# Patient Record
Sex: Male | Born: 1985
Health system: Southern US, Community
[De-identification: ages and names within clinical notes are randomized; demographics above are authoritative.]

## PROBLEM LIST (undated history)

## (undated) DIAGNOSIS — N289 Disorder of kidney and ureter, unspecified: Secondary | ICD-10-CM

## (undated) DIAGNOSIS — K219 Gastro-esophageal reflux disease without esophagitis: Secondary | ICD-10-CM

## (undated) DIAGNOSIS — T7840XA Allergy, unspecified, initial encounter: Secondary | ICD-10-CM

## (undated) DIAGNOSIS — N12 Tubulo-interstitial nephritis, not specified as acute or chronic: Secondary | ICD-10-CM

## (undated) DIAGNOSIS — N135 Crossing vessel and stricture of ureter without hydronephrosis: Secondary | ICD-10-CM

## (undated) HISTORY — DX: Gastro-esophageal reflux disease without esophagitis: K21.9

## (undated) HISTORY — PX: NEPHRECTOMY: SHX65

## (undated) HISTORY — PX: TONSILLECTOMY: SUR1361

## (undated) HISTORY — DX: Allergy, unspecified, initial encounter: T78.40XA

---

## 2012-08-25 DIAGNOSIS — N289 Disorder of kidney and ureter, unspecified: Secondary | ICD-10-CM

## 2012-08-25 DIAGNOSIS — N12 Tubulo-interstitial nephritis, not specified as acute or chronic: Secondary | ICD-10-CM

## 2012-08-25 DIAGNOSIS — Q6211 Congenital occlusion of ureteropelvic junction: Secondary | ICD-10-CM

## 2012-08-25 HISTORY — DX: Disorder of kidney and ureter, unspecified: N28.9

## 2012-08-25 HISTORY — DX: Congenital occlusion of ureteropelvic junction: Q62.11

## 2012-08-25 HISTORY — PX: KIDNEY CYST REMOVAL: SHX684

## 2012-08-25 HISTORY — DX: Tubulo-interstitial nephritis, not specified as acute or chronic: N12

## 2012-10-14 ENCOUNTER — Emergency Department (HOSPITAL_COMMUNITY)
Admission: EM | Admit: 2012-10-14 | Discharge: 2012-10-14 | Disposition: A | Discharge status: Home / Self Care | Payer: Medicaid - Out of State | Attending: Emergency Medicine | Admitting: Emergency Medicine

## 2012-10-14 ENCOUNTER — Emergency Department (HOSPITAL_COMMUNITY): Payer: Medicaid - Out of State

## 2012-10-14 ENCOUNTER — Encounter (HOSPITAL_COMMUNITY): Payer: Self-pay | Admitting: *Deleted

## 2012-10-14 DIAGNOSIS — G8918 Other acute postprocedural pain: Secondary | ICD-10-CM | POA: Insufficient documentation

## 2012-10-14 DIAGNOSIS — R109 Unspecified abdominal pain: Secondary | ICD-10-CM | POA: Insufficient documentation

## 2012-10-14 DIAGNOSIS — Z905 Acquired absence of kidney: Secondary | ICD-10-CM | POA: Insufficient documentation

## 2012-10-14 DIAGNOSIS — F17200 Nicotine dependence, unspecified, uncomplicated: Secondary | ICD-10-CM | POA: Insufficient documentation

## 2012-10-14 DIAGNOSIS — J45909 Unspecified asthma, uncomplicated: Secondary | ICD-10-CM | POA: Insufficient documentation

## 2012-10-14 DIAGNOSIS — Z87448 Personal history of other diseases of urinary system: Secondary | ICD-10-CM | POA: Insufficient documentation

## 2012-10-14 HISTORY — DX: Disorder of kidney and ureter, unspecified: N28.9

## 2012-10-14 HISTORY — DX: Crossing vessel and stricture of ureter without hydronephrosis: N13.5

## 2012-10-14 HISTORY — DX: Tubulo-interstitial nephritis, not specified as acute or chronic: N12

## 2012-10-14 LAB — CBC WITH DIFFERENTIAL/PLATELET
Basophils Absolute: 0 10*3/uL (ref 0.0–0.1)
Basophils Relative: 0 % (ref 0–1)
Eosinophils Absolute: 0.1 10*3/uL (ref 0.0–0.7)
Hemoglobin: 12.7 g/dL — ABNORMAL LOW (ref 13.0–17.0)
MCH: 25.3 pg — ABNORMAL LOW (ref 26.0–34.0)
MCHC: 33.4 g/dL (ref 30.0–36.0)
Monocytes Relative: 8 % (ref 3–12)
Neutro Abs: 2.6 10*3/uL (ref 1.7–7.7)
Neutrophils Relative %: 46 % (ref 43–77)
Platelets: 179 10*3/uL (ref 150–400)
RDW: 14.4 % (ref 11.5–15.5)

## 2012-10-14 LAB — URINALYSIS, ROUTINE W REFLEX MICROSCOPIC
Bilirubin Urine: NEGATIVE
Ketones, ur: NEGATIVE mg/dL
Leukocytes, UA: NEGATIVE
Nitrite: NEGATIVE
Protein, ur: NEGATIVE mg/dL
Urobilinogen, UA: 1 mg/dL (ref 0.0–1.0)
pH: 6 (ref 5.0–8.0)

## 2012-10-14 LAB — COMPREHENSIVE METABOLIC PANEL
AST: 10 U/L (ref 0–37)
Albumin: 3.8 g/dL (ref 3.5–5.2)
Alkaline Phosphatase: 63 U/L (ref 39–117)
BUN: 20 mg/dL (ref 6–23)
Chloride: 105 mEq/L (ref 96–112)
Creatinine, Ser: 1.24 mg/dL (ref 0.50–1.35)
Potassium: 4 mEq/L (ref 3.5–5.1)
Total Protein: 7.2 g/dL (ref 6.0–8.3)

## 2012-10-14 MED ORDER — ONDANSETRON HCL 4 MG/2ML IJ SOLN
4.0000 mg | Freq: Once | INTRAMUSCULAR | Status: AC
  Administered 2012-10-14: 4 mg via INTRAVENOUS
  Filled 2012-10-14: qty 2

## 2012-10-14 MED ORDER — OXYCODONE HCL 5 MG PO TABS: 5.0000 mg | ORAL_TABLET | ORAL | Status: DC | PRN

## 2012-10-14 MED ORDER — MORPHINE SULFATE 4 MG/ML IJ SOLN
4.0000 mg | Freq: Once | INTRAMUSCULAR | Status: AC
  Administered 2012-10-14: 4 mg via INTRAVENOUS
  Filled 2012-10-14: qty 1

## 2012-10-14 MED ORDER — HYDROCODONE-ACETAMINOPHEN 5-325 MG PO TABS: 1.0000 | ORAL_TABLET | Freq: Four times a day (QID) | ORAL | Status: DC | PRN

## 2012-10-14 NOTE — ED Notes (Signed)
Pt in s/p right nephrectomy on 5/6, pt was seen for this in [place] state, but has been in this area for the last two weeks due to him mother being sick, states he had a follow up visit prior to leaving the area and was told everything looked ok, but is in today due to continued pain, states it was going away but now it is coming back again.

## 2012-10-14 NOTE — ED Notes (Addendum)
Pt had sx on 6th of last month, removal of right kidney. 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 sts he is here today because he hasn't been able to sleep with the pain getting so bad. Pt in nad, skin warm and dry, resp e/u.

## 2012-10-14 NOTE — ED Provider Notes (Signed)
History    CSN: [id number] Arrival date & time 10/14/12  1616  First MD Initiated Contact with Patient 10/14/12 1917     Chief Complaint  Patient presents with  . Post-op Problem   (Consider location/radiation/quality/duration/timing/severity/associated sxs/prior Treatment) HPI patient presents emergency department with left flank and right-sided abdominal pain.  Patient, states he had nephrectomy with right kidney.  3 months ago.  Patient, states, that he's not had any nausea, vomiting, diarrhea, chest pain, shortness of breath, fever, cough, weakness, numbness, dizziness, headache, blurred vision, or syncope.  Patient, states, that nothing seems to make his condition, better and palpation makes his pain, worse.  Patient, states, that he needs pain medication as this seemed to help with his symptoms.  Patient, states, that he does not have a doctor here in [place] that follows him. Past Medical History  Diagnosis Date  . Renal disorder 08/25/12    hydronephrosis  . Pyelonephritis 08/25/12  . 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 Asthma    Past Surgical History  Procedure Laterality Date  . Nephrectomy    . Tonsillectomy     Family History  Problem Relation Age of Onset  . Cancer Mother     lung, stage IV   History  Substance Use Topics  . Smoking status: Current Every Day Smoker -- 0.25 packs/day    Types: Cigarettes  . Smokeless tobacco: Not on file  . Alcohol Use: Yes     Comment: occasionally    Review of Systems All other systems negative except as documented in the HPI. All pertinent positives and negatives as reviewed in the HPI. Allergies  Review of patient's allergies indicates no known allergies.  Home Medications   Current Outpatient Rx  Name  Route  Sig  Dispense  Refill  . acetaminophen (TYLENOL) 500 MG tablet   Oral   Take 500 mg by mouth daily as needed for pain.         Marland Kitchen ibuprofen (ADVIL,MOTRIN) 200 MG tablet   Oral   Take 200 mg  by mouth daily as needed for pain.          BP 121/67  Pulse 60  Temp(Src) 98.2 F (36.8 C) (Oral)  Resp 18  Wt 190 lb (86.183 kg)  SpO2 99% Physical Exam  Nursing note and vitals reviewed. Constitutional: He is oriented to person, place, and time. He appears well-developed and well-nourished. No distress.  HENT:  Head: Normocephalic and atraumatic.  Mouth/Throat: Oropharynx is clear and moist.  Eyes: Pupils are equal, round, and reactive to light.  Neck: Normal range of motion. Neck supple.  Cardiovascular: Normal rate, regular rhythm and normal heart sounds.  Exam reveals no gallop and no friction rub.   No murmur heard. Pulmonary/Chest: Effort normal and breath sounds normal.  Abdominal: Soft. Bowel sounds are normal. He exhibits no distension. There is tenderness. There is no rebound and no guarding.  Neurological: He is alert and oriented to person, place, and time.  Skin: Skin is warm and dry. No rash noted. No erythema.    ED Course  Procedures (including critical care time) Labs Reviewed  CBC WITH DIFFERENTIAL - Abnormal; Notable for the following:    Hemoglobin 12.7 (*)    HCT 38.0 (*)    MCV 75.8 (*)    MCH 25.3 (*)    All other components within normal limits  COMPREHENSIVE METABOLIC PANEL - Abnormal; Notable for the following:    Glucose, Bld 100 (*)  GFR calc non Af Amer 79 (*)    All other components within normal limits  URINALYSIS, ROUTINE W REFLEX MICROSCOPIC   Ct Abdomen Pelvis Wo Contrast  10/14/2012   *RADIOLOGY REPORT*  Clinical Data: Right kidney removal 08/25/2012.  Bilateral flank pain.  CT ABDOMEN AND PELVIS WITHOUT CONTRAST  Technique:  Multidetector CT imaging of the abdomen and pelvis was performed following the standard protocol without intravenous contrast.  Comparison: None  Findings: Right kidney is surgically absent.  No fluid collection or mass lesion is seen in the renal bed on the right.  The left kidney is normal.  No left renal  calculus or obstruction.  Urinary bladder appears normal.  Lung bases are clear.  The liver gallbladder and bile ducts are normal.  Pancreas and spleen are normal.  Negative for bowel obstruction or bowel thickening.  Appendix is normal.  No free fluid.  IMPRESSION: Postop right nephrectomy without mass or fluid collection.  No acute abnormality.  Normal appendix.   Original Report Authenticated By: Janeece Riggers, M.D.   patient is, negative, CT scan and lab testing was mostly unremarkable.  Patient is advised to followup with her primary care Dr. patient feels better after pain medication and requests pain medication for home.  The patient would like to go home.  Patient is advised to return here for any worsening in his condition. MDM  MDM Reviewed: nursing note and vitals Interpretation: labs and CT scan      Carlyle Dolly, PA-C 10/16/12 0128

## 2012-10-17 NOTE — ED Provider Notes (Signed)
Medical screening examination/treatment/procedure(s) were performed by non-physician practitioner and as supervising physician I was immediately available for consultation/collaboration.  Doug Sou, MD 10/17/12 [phone]

## 2013-08-27 ENCOUNTER — Ambulatory Visit (INDEPENDENT_AMBULATORY_CARE_PROVIDER_SITE_OTHER): Payer: BC Managed Care – PPO | Admitting: Emergency Medicine

## 2013-08-27 VITALS — BP 128/86 | HR 81 | Temp 98.4°F | Resp 16 | Ht 67.5 in | Wt 224.6 lb

## 2013-08-27 DIAGNOSIS — Z765 Malingerer [conscious simulation]: Secondary | ICD-10-CM

## 2013-08-27 DIAGNOSIS — F1910 Other psychoactive substance abuse, uncomplicated: Secondary | ICD-10-CM

## 2013-08-27 NOTE — Progress Notes (Signed)
[facility] [address], [place] [address] [phone]  Date:  08/27/2013   Name:  Kyle Crane   DOB:  June 22, 1985   MRN:  [id number]  PCP:  No PCP Per Patient    Chief Complaint: Back Pain and Medication Refill   History of Present Illness:  Kyle Crane is a 28 y.o. very pleasant male patient who presents with the following:  Patient gives a history of pain in back related to chronic kidney condition.  Moved here from [place] 3 months ago to care for his mother whose health is failing.  He has been to a number of urgent cares and presents here for a refill on is meds as the prescription he received YESTERDAY was taken by a pharmacy due to a problem.  Denies other complaint or health concern today.   There are no active problems to display for this patient.   Past Medical History  Diagnosis Date  . Renal disorder 08/25/12    hydronephrosis  . Pyelonephritis 08/25/12  . 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 Allergy     Past Surgical History  Procedure Laterality Date  . Nephrectomy    . Tonsillectomy    . Kidney cyst removal  Aug 25 2012    History  Substance Use Topics  . Smoking status: Current Every Day Smoker -- 0.25 packs/day    Types: Cigarettes  . Smokeless tobacco: Not on file  . Alcohol Use: Yes     Comment: occasionally    Family History  Problem Relation Age of Onset  . Cancer Mother     lung, stage IV    Allergies  Allergen Reactions  . Tylenol [Acetaminophen]     Medication list has been reviewed and updated.  Current Outpatient Prescriptions on File Prior to Visit  Medication Sig Dispense Refill  . ibuprofen (ADVIL,MOTRIN) 200 MG tablet Take 200 mg by mouth daily as needed for pain.      Marland Kitchen. oxyCODONE (ROXICODONE) 5 MG immediate release tablet Take 1 tablet (5 mg total) by mouth every 4 (four) hours as needed for pain.  15 tablet  0  . acetaminophen (TYLENOL) 500 MG tablet Take 500 mg by mouth daily as  needed for pain.       No current facility-administered medications on file prior to visit.    Review of Systems:  As per HPI, otherwise negative.    Physical Examination: Filed Vitals:   08/27/13 1352  BP: 128/86  Pulse: 81  Temp: 98.4 F (36.9 C)  Resp: 16   Filed Vitals:   08/27/13 1352  Height: 5' 7.5" (1.715 m)  Weight: 224 lb 9.6 oz (101.878 kg)   Body mass index is 34.64 kg/(m^2). Ideal Body Weight: Weight in (lb) to have BMI = 25: 161.7   GEN: WDWN, NAD, Non-toxic, Alert & Oriented x 3 HEENT: Atraumatic, Normocephalic.  Ears and Nose: No external deformity. EXTR: No clubbing/cyanosis/edema NEURO: Normal gait.  PSYCH: Normally interactive. Conversant. Not depressed or anxious appearing.  Calm demeanor.    Assessment and Plan: Patient appears to have been urgency care shopping.  No medications were prescribed.  He was invited to bring or obtain medical records substantiating his narcotic need.  Signed,  Phillips OdorJeffery Solveig Fangman, MD

## 2013-10-30 ENCOUNTER — Emergency Department (HOSPITAL_COMMUNITY): Payer: BLUE CROSS/BLUE SHIELD

## 2013-10-30 ENCOUNTER — Encounter (HOSPITAL_COMMUNITY): Payer: Self-pay | Admitting: Emergency Medicine

## 2013-10-30 ENCOUNTER — Other Ambulatory Visit (HOSPITAL_COMMUNITY): Payer: Medicaid - Out of State

## 2013-10-30 ENCOUNTER — Emergency Department (HOSPITAL_COMMUNITY)
Admission: EM | Admit: 2013-10-30 | Discharge: 2013-10-30 | Disposition: A | Discharge status: Home / Self Care | Payer: BLUE CROSS/BLUE SHIELD | Attending: Emergency Medicine | Admitting: Emergency Medicine

## 2013-10-30 DIAGNOSIS — Z905 Acquired absence of kidney: Secondary | ICD-10-CM | POA: Diagnosis not present

## 2013-10-30 DIAGNOSIS — J029 Acute pharyngitis, unspecified: Secondary | ICD-10-CM | POA: Insufficient documentation

## 2013-10-30 DIAGNOSIS — M545 Low back pain, unspecified: Secondary | ICD-10-CM | POA: Diagnosis not present

## 2013-10-30 DIAGNOSIS — Z87448 Personal history of other diseases of urinary system: Secondary | ICD-10-CM | POA: Insufficient documentation

## 2013-10-30 DIAGNOSIS — F17200 Nicotine dependence, unspecified, uncomplicated: Secondary | ICD-10-CM | POA: Insufficient documentation

## 2013-10-30 DIAGNOSIS — R5381 Other malaise: Secondary | ICD-10-CM | POA: Insufficient documentation

## 2013-10-30 DIAGNOSIS — M549 Dorsalgia, unspecified: Secondary | ICD-10-CM | POA: Diagnosis present

## 2013-10-30 DIAGNOSIS — R5383 Other fatigue: Secondary | ICD-10-CM

## 2013-10-30 LAB — CBC WITH DIFFERENTIAL/PLATELET
BASOS ABS: 0 10*3/uL (ref 0.0–0.1)
BASOS PCT: 0 % (ref 0–1)
Eosinophils Absolute: 0.1 10*3/uL (ref 0.0–0.7)
Eosinophils Relative: 2 % (ref 0–5)
HCT: 39.1 % (ref 39.0–52.0)
HEMOGLOBIN: 12.6 g/dL — AB (ref 13.0–17.0)
Lymphocytes Relative: 38 % (ref 12–46)
Lymphs Abs: 2.1 10*3/uL (ref 0.7–4.0)
MCH: 24.9 pg — ABNORMAL LOW (ref 26.0–34.0)
MCHC: 32.2 g/dL (ref 30.0–36.0)
MCV: 77.3 fL — ABNORMAL LOW (ref 78.0–100.0)
MONOS PCT: 8 % (ref 3–12)
Monocytes Absolute: 0.4 10*3/uL (ref 0.1–1.0)
NEUTROS ABS: 3 10*3/uL (ref 1.7–7.7)
NEUTROS PCT: 52 % (ref 43–77)
PLATELETS: 206 10*3/uL (ref 150–400)
RBC: 5.06 MIL/uL (ref 4.22–5.81)
RDW: 15.3 % (ref 11.5–15.5)
WBC: 5.7 10*3/uL (ref 4.0–10.5)

## 2013-10-30 LAB — URINALYSIS, ROUTINE W REFLEX MICROSCOPIC
BILIRUBIN URINE: NEGATIVE
GLUCOSE, UA: NEGATIVE mg/dL
Hgb urine dipstick: NEGATIVE
KETONES UR: 15 mg/dL — AB
Nitrite: NEGATIVE
PH: 6 (ref 5.0–8.0)
Protein, ur: NEGATIVE mg/dL
SPECIFIC GRAVITY, URINE: 1.021 (ref 1.005–1.030)
Urobilinogen, UA: 1 mg/dL (ref 0.0–1.0)

## 2013-10-30 LAB — COMPREHENSIVE METABOLIC PANEL
ALBUMIN: 3.9 g/dL (ref 3.5–5.2)
ALK PHOS: 55 U/L (ref 39–117)
ALT: 14 U/L (ref 0–53)
AST: 12 U/L (ref 0–37)
Anion gap: 13 (ref 5–15)
BILIRUBIN TOTAL: 0.7 mg/dL (ref 0.3–1.2)
BUN: 9 mg/dL (ref 6–23)
CHLORIDE: 104 meq/L (ref 96–112)
CO2: 25 mEq/L (ref 19–32)
Calcium: 9.2 mg/dL (ref 8.4–10.5)
Creatinine, Ser: 1.23 mg/dL (ref 0.50–1.35)
GFR calc Af Amer: >90 mL/min (ref >90)
GFR calc non Af Amer: 79 mL/min — ABNORMAL LOW (ref >90)
Glucose, Bld: 118 mg/dL — ABNORMAL HIGH (ref 70–99)
POTASSIUM: 4.2 meq/L (ref 3.7–5.3)
SODIUM: 142 meq/L (ref 137–147)
TOTAL PROTEIN: 7.3 g/dL (ref 6.0–8.3)

## 2013-10-30 LAB — URINE MICROSCOPIC-ADD ON

## 2013-10-30 LAB — RAPID STREP SCREEN (MED CTR MEBANE ONLY): Streptococcus, Group A Screen (Direct): NEGATIVE

## 2013-10-30 MED ORDER — TRAMADOL HCL 50 MG PO TABS: 50.0000 mg | ORAL_TABLET | Freq: Four times a day (QID) | ORAL | Status: DC | PRN

## 2013-10-30 MED ORDER — OXYCODONE HCL 5 MG PO TABS: 5.0000 mg | ORAL_TABLET | ORAL | Status: DC | PRN

## 2013-10-30 NOTE — ED Notes (Addendum)
Onset 1 week dysuria, frequency. No blood noted in urine.  Pt had right kidney removed last year.  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  Pain is increasing.  No numbness/tingling in LE. Onset this morning nausea.  Onset 3 days sore throat, painful to swallow.   No other s/s noted.

## 2013-10-30 NOTE — ED Notes (Signed)
Pt continues to be monitored by 5 lead, blood pressure, and pulse ox. Pt provided with urinal to provide urine specimen.

## 2013-10-30 NOTE — Discharge Instructions (Signed)
Drink plenty of fluids and get plenty of rest.  Return to emergency department if your symptoms substantially worsen or change, and follow up with your primary doctor if not improving in the next week.   Pharyngitis Pharyngitis is redness, pain, and swelling (inflammation) of your pharynx.  CAUSES  Pharyngitis is usually caused by infection. Most of the time, these infections are from viruses (viral) and are part of a cold. However, sometimes pharyngitis is caused by bacteria (bacterial). Pharyngitis can also be caused by allergies. Viral pharyngitis may be spread from person to person by coughing, sneezing, and personal items or utensils (cups, forks, spoons, toothbrushes). Bacterial pharyngitis may be spread from person to person by more intimate contact, such as kissing.  SIGNS AND SYMPTOMS  Symptoms of pharyngitis include:   Sore throat.   Tiredness (fatigue).   Low-grade fever.   Headache.  Joint pain and muscle aches.  Skin rashes.  Swollen lymph nodes.  Plaque-like film on throat or tonsils (often seen with bacterial pharyngitis). DIAGNOSIS  Your health care provider will ask you questions about your illness and your symptoms. Your medical history, along with a physical exam, is often all that is needed to diagnose pharyngitis. Sometimes, a rapid strep test is done. Other lab tests may also be done, depending on the suspected cause.  TREATMENT  Viral pharyngitis will usually get better in 3-4 days without the use of medicine. Bacterial pharyngitis is treated with medicines that kill germs (antibiotics).  HOME CARE INSTRUCTIONS   Drink enough water and fluids to keep your urine clear or pale yellow.   Only take over-the-counter or prescription medicines as directed by your health care provider:   If you are prescribed antibiotics, make sure you finish them even if you start to feel better.   Do not take aspirin.   Get lots of rest.   Gargle with 8 oz of salt  water ( tsp of salt per 1 qt of water) as often as every 1-2 hours to soothe your throat.   Throat lozenges (if you are not at risk for choking) or sprays may be used to soothe your throat. SEEK MEDICAL CARE IF:   You have large, tender lumps in your neck.  You have a rash.  You cough up green, yellow-brown, or bloody spit. SEEK IMMEDIATE MEDICAL CARE IF:   Your neck becomes stiff.  You drool or are unable to swallow liquids.  You vomit or are unable to keep medicines or liquids down.  You have severe pain that does not go away with the use of recommended medicines.  You have trouble breathing (not caused by a stuffy nose). MAKE SURE YOU:   Understand these instructions.  Will watch your condition.  Will get help right away if you are not doing well or get worse. Document Released: 04/08/2005 Document Revised: 01/27/2013 Document Reviewed: 12/14/2012 [facility] Patient Information 2015 [employer] [place]. This information is not intended to replace advice given to you by your health care provider. Make sure you discuss any questions you have with your health care provider.

## 2013-10-30 NOTE — ED Notes (Signed)
Pt placed back on monitor upon return to room from radiology. Pt remains monitored by 5 lead, blood pressure, and pulse ox.

## 2013-10-30 NOTE — ED Provider Notes (Addendum)
CSN: [id number]     Arrival date & time 10/30/13  1106 History   First MD Initiated Contact with Patient 10/30/13 1118     Chief Complaint  Patient presents with  . Back Pain  . Sore Throat     (Consider location/radiation/quality/duration/timing/severity/associated sxs/prior Treatment) HPI Comments: Patient is a 28 year old male with history of right nephrectomy performed in May of 2014 secondary to some sort of congenital abnormality. This was performed in [place]. He presents today with complaints of low back discomfort, difficulty urinating, sore throat, and generally not feeling well. He states this has worsened over the past week. He denies any injury or trauma. He denies any fevers or chills.  Patient is a 28 y.o. male presenting with back pain and pharyngitis. The history is provided by the patient.  Back Pain Location:  Lumbar spine Quality:  Stiffness Stiffness is present:  All day Radiates to:  Does not radiate Pain severity:  Moderate Pain is:  Same all the time Onset quality:  Sudden Duration:  1 week Timing:  Constant Progression:  Worsening Chronicity:  New Sore Throat    Past Medical History  Diagnosis Date  . Renal disorder 08/25/12    hydronephrosis  . Pyelonephritis 08/25/12  . 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 Allergy    Past Surgical History  Procedure Laterality Date  . Nephrectomy    . Tonsillectomy    . Kidney cyst removal  Aug 25 2012   Family History  Problem Relation Age of Onset  . Cancer Mother     lung, stage IV   History  Substance Use Topics  . Smoking status: Current Every Day Smoker -- 0.25 packs/day    Types: Cigarettes  . Smokeless tobacco: Not on file  . Alcohol Use: Yes     Comment: occasionally    Review of Systems  Musculoskeletal: Positive for back pain.  All other systems reviewed and are negative.     Allergies  Tylenol  Home Medications   Prior to Admission medications   Not on File    BP 132/70  Pulse 53  Temp(Src) 98.1 F (36.7 C) (Oral)  Resp 18  Ht 5\' 9"  (1.753 m)  SpO2 96% Physical Exam  Nursing note and vitals reviewed. Constitutional: He is oriented to person, place, and time. He appears well-developed and well-nourished. No distress.  HENT:  Head: Normocephalic and atraumatic.  Mouth/Throat: Oropharynx is clear and moist.  Neck: Normal range of motion. Neck supple.  Cardiovascular: Normal rate, regular rhythm and normal heart sounds.   No murmur heard. Pulmonary/Chest: Effort normal and breath sounds normal. No respiratory distress. He has no wheezes.  Abdominal: Soft. Bowel sounds are normal. He exhibits no distension. There is no tenderness.  Musculoskeletal: Normal range of motion. He exhibits no edema.  Lymphadenopathy:    He has no cervical adenopathy.  Neurological: He is alert and oriented to person, place, and time.  Skin: Skin is warm and dry. He is not diaphoretic.    ED Course  Procedures (including critical care time) Labs Review Labs Reviewed  RAPID STREP SCREEN  CBC WITH DIFFERENTIAL  COMPREHENSIVE METABOLIC PANEL  URINALYSIS, ROUTINE W REFLEX MICROSCOPIC    Imaging Review No results found.   EKG Interpretation None      MDM   Final diagnoses:  None    Patient with history of nephrectomy presents with complaints of generalized malaise, sore throat, and body aches. Is having back pain and is concerned about  his kidney function. His creatinine is normal and CT scan reveals no evidence for obstructing calculus or other acute abnormality. He appears to be feeling better. At this point I feel as though he is appropriate for discharge. He is requesting something to help with his discomfort which I will provide. I have advised him to drink plenty of fluids, get plenty of rest, and return to the emergency department if he worsens over the next few days.    Geoffery Lyonsouglas Reola Buckles, MD 10/30/13 1436  Geoffery Lyonsouglas Undra Trembath, MD 10/31/13 1258

## 2013-10-30 NOTE — ED Notes (Signed)
Pt continues to be monitored by 5 lead, blood pressure, and pulse ox. Pt provided apple juice per nurse.

## 2013-10-30 NOTE — ED Notes (Signed)
Patient transported to CT 

## 2013-10-30 NOTE — ED Notes (Signed)
Pt reports history of having right kidney removed. Reports working recently and felt something pop in his back on right side and now also having difficulty urinating. Also wants to be seen for sore throat. No acute distress noted at triage, airway intact.

## 2013-10-31 ENCOUNTER — Emergency Department (HOSPITAL_COMMUNITY)
Admission: EM | Admit: 2013-10-31 | Discharge: 2013-10-31 | Disposition: A | Discharge status: Home / Self Care | Payer: BLUE CROSS/BLUE SHIELD | Attending: Emergency Medicine | Admitting: Emergency Medicine

## 2013-10-31 ENCOUNTER — Emergency Department (HOSPITAL_COMMUNITY): Payer: BLUE CROSS/BLUE SHIELD

## 2013-10-31 ENCOUNTER — Encounter (HOSPITAL_COMMUNITY): Payer: Self-pay | Admitting: Emergency Medicine

## 2013-10-31 ENCOUNTER — Emergency Department (HOSPITAL_COMMUNITY)
Admission: EM | Admit: 2013-10-31 | Discharge: 2013-10-31 | Discharge status: Against Medical Advice | Payer: BLUE CROSS/BLUE SHIELD | Source: Home / Self Care

## 2013-10-31 DIAGNOSIS — Z79899 Other long term (current) drug therapy: Secondary | ICD-10-CM | POA: Insufficient documentation

## 2013-10-31 DIAGNOSIS — K209 Esophagitis, unspecified without bleeding: Secondary | ICD-10-CM | POA: Insufficient documentation

## 2013-10-31 DIAGNOSIS — R079 Chest pain, unspecified: Secondary | ICD-10-CM

## 2013-10-31 DIAGNOSIS — Z87448 Personal history of other diseases of urinary system: Secondary | ICD-10-CM | POA: Diagnosis not present

## 2013-10-31 DIAGNOSIS — M549 Dorsalgia, unspecified: Secondary | ICD-10-CM | POA: Insufficient documentation

## 2013-10-31 DIAGNOSIS — R112 Nausea with vomiting, unspecified: Secondary | ICD-10-CM | POA: Diagnosis present

## 2013-10-31 DIAGNOSIS — F17200 Nicotine dependence, unspecified, uncomplicated: Secondary | ICD-10-CM | POA: Insufficient documentation

## 2013-10-31 MED ORDER — ONDANSETRON HCL 4 MG/2ML IJ SOLN
4.0000 mg | Freq: Once | INTRAMUSCULAR | Status: AC
  Administered 2013-10-31: 4 mg via INTRAVENOUS
  Filled 2013-10-31: qty 2

## 2013-10-31 MED ORDER — LANSOPRAZOLE 30 MG PO CPDR: 30.0000 mg | DELAYED_RELEASE_CAPSULE | Freq: Every day | ORAL | Status: DC

## 2013-10-31 MED ORDER — GI COCKTAIL ~~LOC~~
30.0000 mL | Freq: Once | ORAL | Status: AC
Start: 2013-10-31 — End: 2013-10-31
  Administered 2013-10-31: 30 mL via ORAL
  Filled 2013-10-31: qty 30

## 2013-10-31 NOTE — ED Notes (Signed)
Patient left.

## 2013-10-31 NOTE — ED Notes (Signed)
Pt. Stated, I was here yesterday with the same symptoms and Im no better.  Its difficult to swallow and feels like something inside my chest.

## 2013-10-31 NOTE — ED Notes (Addendum)
PT reports having been here yesterday for a sore throat; came in today to get checked out for central CP that occurs after swallowing food, but had something to do so left the ED and then began vomiting 4-5x and decided it wasn't such a great idea to leave so he came back. PT reports sore throat and fever when he was here the other day. PT states he feels CP after he swallows food and that the pain is intense. He states he drinks a lot of liquid to get the food past and then the CP decreases from a 10/10 to 8/10. N/v onset today. PT states he still feels like something is stuck in his esophagus

## 2013-10-31 NOTE — ED Notes (Signed)
Last BM 10/30/13. PT reports unable to eat b/c scared of the pain it creates, but is hungry

## 2013-10-31 NOTE — Discharge Instructions (Signed)
Esophagitis Esophagitis is inflammation of the esophagus. It can involve swelling, soreness, and pain in the esophagus. This condition can make it difficult and painful to swallow. CAUSES  Most causes of esophagitis are not serious. Many different factors can cause esophagitis, including:  Gastroesophageal reflux disease (GERD). This is when acid from your stomach flows up into the esophagus.  Recurrent vomiting.  An allergic-type reaction.  Certain medicines, especially those that come in large pills.  Ingestion of harmful chemicals, such as household cleaning products.  Heavy alcohol use.  An infection of the esophagus.  Radiation treatment for cancer.  Certain diseases such as sarcoidosis, Crohn's disease, and scleroderma. These diseases may cause recurrent esophagitis. SYMPTOMS   Trouble swallowing.  Painful swallowing.  Chest pain.  Difficulty breathing.  Nausea.  Vomiting.  Abdominal pain. DIAGNOSIS  Your caregiver will take your history and do a physical exam. Depending upon what your caregiver finds, certain tests may also be done, including:  Barium X-ray. You will drink a solution that coats the esophagus, and X-rays will be taken.  Endoscopy. A lighted tube is put down the esophagus so your caregiver can examine the area.  Allergy tests. These can sometimes be arranged through follow-up visits. TREATMENT  Treatment will depend on the cause of your esophagitis. In some cases, steroids or other medicines may be given to help relieve your symptoms or to treat the underlying cause of your condition. Medicines that may be recommended include:  Viscous lidocaine, to soothe the esophagus.  Antacids.  Acid reducers.  Proton pump inhibitors.  Antiviral medicines for certain viral infections of the esophagus.  Antifungal medicines for certain fungal infections of the esophagus.  Antibiotic medicines, depending on the cause of the esophagitis. HOME CARE  INSTRUCTIONS   Avoid foods and drinks that seem to make your symptoms worse.  Eat small, frequent meals instead of large meals.  Avoid eating for the 3 hours prior to your bedtime.  If you have trouble taking pills, use a pill splitter to decrease the size and likelihood of the pill getting stuck or injuring the esophagus on the way down. Drinking water after taking a pill also helps.  Stop smoking if you smoke.  Maintain a healthy weight.  Wear loose-fitting clothing. Do not wear anything tight around your waist that causes pressure on your stomach.  Raise the head of your bed 6 to 8 inches with wood blocks to help you sleep. Extra pillows will not help.  Only take over-the-counter or prescription medicines as directed by your caregiver. SEEK IMMEDIATE MEDICAL CARE IF:  You have severe chest pain that radiates into your arm, neck, or jaw.  You feel sweaty, dizzy, or lightheaded.  You have shortness of breath.  You vomit blood.  You have difficulty or pain with swallowing.  You have bloody or black, tarry stools.  You have a fever.  You have a burning sensation in the chest more than 3 times a week for more than 2 weeks.  You cannot swallow, drink, or eat.  You drool because you cannot swallow your saliva. MAKE SURE YOU:  Understand these instructions.  Will watch your condition.  Will get help right away if you are not doing well or get worse. Document Released: 05/16/2004 Document Revised: 07/01/2011 Document Reviewed: 12/07/2010 [employer] Patient Information 2015 [employer]. This information is not intended to replace advice given to you by your health care provider. Make sure you discuss any questions you have with your health care provider.  

## 2013-10-31 NOTE — ED Notes (Signed)
MD at bedside.-[place] 

## 2013-10-31 NOTE — ED Notes (Signed)
PT attempting to try PO fluids

## 2013-10-31 NOTE — ED Notes (Signed)
Refused wheelchair 

## 2013-10-31 NOTE — ED Notes (Signed)
Pt returns to ED for center chest pain ongoing for 3-4 days. Pt was here around 1100 went through triage but left. Pt was also seen here yesterday for same.

## 2013-10-31 NOTE — ED Provider Notes (Signed)
CSN: [id number]     Arrival date & time 10/31/13  1707 History   First MD Initiated Contact with Patient 10/31/13 1753     Chief Complaint  Patient presents with  . Chest Pain  . Vomiting     (Consider location/radiation/quality/duration/timing/severity/associated sxs/prior Treatment) Patient is a 28 y.o. male presenting with chest pain. The history is provided by the patient.  Chest Pain Associated symptoms: back pain, nausea and vomiting   Associated symptoms: no abdominal pain, no fever, no headache, no numbness, no shortness of breath and no weakness    patient has pain in his lower chest. He is on midline. He states he's had nausea vomiting. He states it feels as if something gets stuck in there. He states he was seen in ER yesterday and had a CT scan of his abdomen and a strep test. He states now it hurts more in his chest. No fevers. No difficulty breathing. No aspiration. No choking episode. He has not had episodes of this before. He does have a history of chronic pain due to the previous nephrectomy. He is able to handle his own saliva  Past Medical History  Diagnosis Date  . Renal disorder 08/25/12    hydronephrosis  . Pyelonephritis 08/25/12  . 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 Allergy    Past Surgical History  Procedure Laterality Date  . Nephrectomy    . Tonsillectomy    . Kidney cyst removal  Aug 25 2012   Family History  Problem Relation Age of Onset  . Cancer Mother     lung, stage IV   History  Substance Use Topics  . Smoking status: Current Every Day Smoker -- 0.25 packs/day    Types: Cigarettes  . Smokeless tobacco: Not on file  . Alcohol Use: Yes     Comment: occasionally    Review of Systems  Constitutional: Negative for fever, activity change and appetite change.  Eyes: Negative for pain.  Respiratory: Negative for chest tightness and shortness of breath.   Cardiovascular: Positive for chest pain. Negative for leg swelling.   Gastrointestinal: Positive for nausea and vomiting. Negative for abdominal pain and diarrhea.  Genitourinary: Negative for flank pain.  Musculoskeletal: Positive for back pain. Negative for neck stiffness.  Skin: Negative for rash.  Neurological: Negative for weakness, numbness and headaches.  Psychiatric/Behavioral: Negative for behavioral problems.      Allergies  Tylenol  Home Medications   Prior to Admission medications   Medication Sig Start Date End Date Taking? Authorizing Provider  lansoprazole (PREVACID) 30 MG capsule Take 1 capsule (30 mg total) by mouth daily at 12 noon. 10/31/13   Juliet RudeNathan R. Crew Goren, MD  oxyCODONE (ROXICODONE) 5 MG immediate release tablet Take 1 tablet (5 mg total) by mouth every 4 (four) hours as needed for severe pain. 10/30/13   Geoffery Lyonsouglas Delo, MD  traMADol (ULTRAM) 50 MG tablet Take 1 tablet (50 mg total) by mouth every 6 (six) hours as needed. 10/30/13   Geoffery Lyonsouglas Delo, MD   BP 115/72  Pulse 107  Temp(Src) 98.1 F (36.7 C) (Oral)  Resp 12  Ht 5\' 9"  (1.753 m)  Wt 215 lb (97.523 kg)  BMI 31.74 kg/m2  SpO2 98% Physical Exam  Nursing note and vitals reviewed. Constitutional: He is oriented to person, place, and time. He appears well-developed and well-nourished.  HENT:  Head: Normocephalic and atraumatic.  Eyes: EOM are normal. Pupils are equal, round, and reactive to light.  Neck: Normal range of  motion. Neck supple.  Cardiovascular: Normal rate, regular rhythm and normal heart sounds.   No murmur heard. Pulmonary/Chest: Effort normal and breath sounds normal.  Abdominal: Soft. Bowel sounds are normal. He exhibits no distension and no mass. There is no tenderness. There is no rebound and no guarding.  Musculoskeletal: Normal range of motion. He exhibits no edema.  Neurological: He is alert and oriented to person, place, and time. No cranial nerve deficit.  Skin: Skin is warm and dry.  Psychiatric: He has a normal mood and affect.    ED Course   Procedures (including critical care time) Labs Review Labs Reviewed - No data to display  Imaging Review Ct Abdomen Pelvis Wo Contrast  10/30/2013   CLINICAL DATA:  back pain, possible renal calculus. History of nephrectomy  EXAM: CT ABDOMEN AND PELVIS WITHOUT CONTRAST  TECHNIQUE: Multidetector CT imaging of the abdomen and pelvis was performed following the standard protocol without IV contrast.  COMPARISON:  CT 10/14/2012  FINDINGS: Lung bases are clear.  No pericardial fluid.  No focal hepatic lesion. The gallbladder, pancreas, spleen, adrenal glands normal.  No nodularity in the right nephrectomy bed. No renal calculi on the left. No the left ureterolithiasis.  The stomach, small bowel, appendix, cecum normal. The colon and rectosigmoid colon are normal.  Abdominal aorta is normal caliber. No retroperitoneal periportal lymphadenopathy. No pelvis no bladder calculi. Prostate gland is normal. No pelvic lymphadenopathy. No aggressive osseous lesion  IMPRESSION: 1. No left nephrolithiasis or ureterolithiasis.  No bladder calculi. 2. Stable right nephrectomy bed. 3. Normal appendix.   Electronically Signed   By: Genevive Bi M.D.   On: 10/30/2013 13:54   Dg Chest 2 View  10/31/2013   CLINICAL DATA:  Chest pain  EXAM: CHEST  2 VIEW  COMPARISON:  None.  FINDINGS: Lungs are clear. The heart size and pulmonary vascularity are normal. No adenopathy No pneumothorax. No bone lesions.  IMPRESSION: No abnormality noted.   Electronically Signed   By: Bretta Bang M.D.   On: 10/31/2013 18:48     EKG Interpretation None      MDM   Final diagnoses:  Esophagitis    Patient with the feeling of food getting caught in his lower throat. Feels much better after GI cocktail. Has had recent abdominal CT. Recent negative stress test. EKG reassuring. Chest x-ray does not show clear pathology. Patient tolerated orals will be discharged home to follow with gastroenterology.    Juliet Rude. Rubin Payor,  MD 10/31/13 2031

## 2013-11-01 LAB — CULTURE, GROUP A STREP

## 2016-12-24 ENCOUNTER — Ambulatory Visit (INDEPENDENT_AMBULATORY_CARE_PROVIDER_SITE_OTHER): Payer: BLUE CROSS/BLUE SHIELD | Admitting: Family Medicine

## 2016-12-24 ENCOUNTER — Encounter: Payer: Self-pay | Admitting: Family Medicine

## 2016-12-24 VITALS — BP 110/80 | Temp 98.4°F | Ht 69.0 in | Wt 243.6 lb

## 2016-12-24 DIAGNOSIS — Z905 Acquired absence of kidney: Secondary | ICD-10-CM

## 2016-12-24 DIAGNOSIS — R109 Unspecified abdominal pain: Secondary | ICD-10-CM | POA: Diagnosis not present

## 2016-12-24 DIAGNOSIS — F17200 Nicotine dependence, unspecified, uncomplicated: Secondary | ICD-10-CM | POA: Diagnosis not present

## 2016-12-24 LAB — CBC WITH DIFFERENTIAL/PLATELET
BASOS ABS: 0 10*3/uL (ref 0.0–0.1)
Basophils Relative: 0.7 % (ref 0.0–3.0)
EOS PCT: 1.8 % (ref 0.0–5.0)
Eosinophils Absolute: 0.1 10*3/uL (ref 0.0–0.7)
HCT: 41.4 % (ref 39.0–52.0)
HEMOGLOBIN: 13.4 g/dL (ref 13.0–17.0)
LYMPHS ABS: 2 10*3/uL (ref 0.7–4.0)
Lymphocytes Relative: 36.3 % (ref 12.0–46.0)
MCHC: 32.5 g/dL (ref 30.0–36.0)
MCV: 77.7 fl — AB (ref 78.0–100.0)
MONO ABS: 0.6 10*3/uL (ref 0.1–1.0)
Monocytes Relative: 10.7 % (ref 3.0–12.0)
Neutro Abs: 2.8 10*3/uL (ref 1.4–7.7)
Neutrophils Relative %: 50.5 % (ref 43.0–77.0)
Platelets: 169 10*3/uL (ref 150.0–400.0)
RBC: 5.33 Mil/uL (ref 4.22–5.81)
RDW: 15.3 % (ref 11.5–15.5)
WBC: 5.6 10*3/uL (ref 4.0–10.5)

## 2016-12-24 LAB — BASIC METABOLIC PANEL
BUN: 14 mg/dL (ref 6–23)
CHLORIDE: 104 meq/L (ref 96–112)
CO2: 27 meq/L (ref 19–32)
Calcium: 9.9 mg/dL (ref 8.4–10.5)
Creatinine, Ser: 1.17 mg/dL (ref 0.40–1.50)
GFR: 93.89 mL/min (ref >60.00)
GLUCOSE: 102 mg/dL — AB (ref 70–99)
Potassium: 4.2 mEq/L (ref 3.5–5.1)
SODIUM: 140 meq/L (ref 135–145)

## 2016-12-24 MED ORDER — VARENICLINE TARTRATE 1 MG PO TABS: 1.0000 mg | ORAL_TABLET | Freq: Two times a day (BID) | ORAL | 1 refills | Status: DC

## 2016-12-24 MED ORDER — VARENICLINE TARTRATE 0.5 MG X 11 & 1 MG X 42 PO MISC: ORAL | 0 refills | Status: DC

## 2016-12-24 NOTE — Patient Instructions (Addendum)
We will be setting up chronic pain management referral. Steps to Quit Smoking Smoking tobacco can be harmful to your health and can affect almost every organ in your body. Smoking puts you, and those around you, at risk for developing many serious chronic diseases. Quitting smoking is difficult, but it is one of the best things that you can do for your health. It is never too late to quit. What are the benefits of quitting smoking? When you quit smoking, you lower your risk of developing serious diseases and conditions, such as:  Lung cancer or lung disease, such as COPD.  Heart disease.  Stroke.  Heart attack.  Infertility.  Osteoporosis and bone fractures.  Additionally, symptoms such as coughing, wheezing, and shortness of breath may get better when you quit. You may also find that you get sick less often because your body is stronger at fighting off colds and infections. If you are pregnant, quitting smoking can help to reduce your chances of having a baby of low birth weight. How do I get ready to quit? When you decide to quit smoking, create a plan to make sure that you are successful. Before you quit:  Pick a date to quit. Set a date within the next two weeks to give you time to prepare.  Write down the reasons why you are quitting. Keep this list in places where you will see it often, such as on your bathroom mirror or in your car or wallet.  Identify the people, places, things, and activities that make you want to smoke (triggers) and avoid them. Make sure to take these actions: ? Throw away all cigarettes at home, at work, and in your car. ? Throw away smoking accessories, such as Set designerashtrays and lighters. ? Clean your car and make sure to empty the ashtray. ? Clean your home, including curtains and carpets.  Tell your family, friends, and coworkers that you are quitting. Support from your loved ones can make quitting easier.  Talk with your health care provider about your  options for quitting smoking.  Find out what treatment options are covered by your health insurance.  What strategies can I use to quit smoking? Talk with your healthcare provider about different strategies to quit smoking. Some strategies include:  Quitting smoking altogether instead of gradually lessening how much you smoke over a period of time. Research shows that quitting "cold [place]key" is more successful than gradually quitting.  Attending in-person counseling to help you build problem-solving skills. You are more likely to have success in quitting if you attend several counseling sessions. Even short sessions of 10 minutes can be effective.  Finding resources and support systems that can help you to quit smoking and remain smoke-free after you quit. These resources are most helpful when you use them often. They can include: ? Online chats with a Veterinary surgeoncounselor. ? Telephone quitlines. ? Automotive engineerrinted self-help materials. ? Support groups or group counseling. ? Text messaging programs. ? Mobile phone applications.  Taking medicines to help you quit smoking. (If you are pregnant or breastfeeding, talk with your health care provider first.) Some medicines contain nicotine and some do not. Both types of medicines help with cravings, but the medicines that include nicotine help to relieve withdrawal symptoms. Your health care provider may recommend: ? Nicotine patches, gum, or lozenges. ? Nicotine inhalers or sprays. ? Non-nicotine medicine that is taken by mouth.  Talk with your health care provider about combining strategies, such as taking medicines while you are  also receiving in-person counseling. Using these two strategies together makes you more likely to succeed in quitting than if you used either strategy on its own. If you are pregnant or breastfeeding, talk with your health care provider about finding counseling or other support strategies to quit smoking. Do not take medicine to help you  quit smoking unless told to do so by your health care provider. What things can I do to make it easier to quit? Quitting smoking might feel overwhelming at first, but there is a lot that you can do to make it easier. Take these important actions:  Reach out to your family and friends and ask that they support and encourage you during this time. Call telephone quitlines, reach out to support groups, or work with a counselor for support.  Ask people who smoke to avoid smoking around you.  Avoid places that trigger you to smoke, such as bars, parties, or smoke-break areas at work.  Spend time around people who do not smoke.  Lessen stress in your life, because stress can be a smoking trigger for some people. To lessen stress, try: ? Exercising regularly. ? Deep-breathing exercises. ? Yoga. ? Meditating. ? Performing a body scan. This involves closing your eyes, scanning your body from head to toe, and noticing which parts of your body are particularly tense. Purposefully relax the muscles in those areas.  Download or purchase mobile phone or tablet apps (applications) that can help you stick to your quit plan by providing reminders, tips, and encouragement. There are many free apps, such as QuitGuide from the [employer] developer for Disease Control and Prevention). You can find other support for quitting smoking (smoking cessation) through smokefree.gov and other websites.  How will I feel when I quit smoking? Within the first 24 hours of quitting smoking, you may start to feel some withdrawal symptoms. These symptoms are usually most noticeable 2-3 days after quitting, but they usually do not last beyond 2-3 weeks. Changes or symptoms that you might experience include:  Mood swings.  Restlessness, anxiety, or irritation.  Difficulty concentrating.  Dizziness.  Strong cravings for sugary foods in addition to nicotine.  Mild weight gain.  Constipation.  Nausea.  Coughing or a sore  throat.  Changes in how your medicines work in your body.  A depressed mood.  Difficulty sleeping (insomnia).  After the first 2-3 weeks of quitting, you may start to notice more positive results, such as:  Improved sense of smell and taste.  Decreased coughing and sore throat.  Slower heart rate.  Lower blood pressure.  Clearer skin.  The ability to breathe more easily.  Fewer sick days.  Quitting smoking is very challenging for most people. Do not get discouraged if you are not successful the first time. Some people need to make many attempts to quit before they achieve long-term success. Do your best to stick to your quit plan, and talk with your health care provider if you have any questions or concerns. This information is not intended to replace advice given to you by your health care provider. Make sure you discuss any questions you have with your health care provider. Document Released: 04/02/2001 Document Revised: 12/05/2015 Document Reviewed: 08/23/2014 Elsevier Interactive Patient Education  2017 [employer].

## 2016-12-24 NOTE — Progress Notes (Signed)
Subjective:     Patient ID: Kyle Crane, male   DOB: [birth year], 31 y.o.   MRN: [id number]  HPI Patient is seen to establish care. His major complaint is chronic right flank pain. He states back in 2014 when he lived in the [place] area he had right nephrectomy. 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  He states ever since his surgeries has had pain which he estimates 8-9 out of 10 severity and is "sharp". Pain is relatively constant and worse with movement. He had CT scan abdomen and pelvis in 2015 which showed no significant abnormalities. He has good appetite. He states his had some gradual weight gain in recent years. No dysuria. No hematuria.  Patient states he was treated with opioids with oxycodone and for a time hydrocodone and states that they controlled his pain well. This was several years ago. He avoids nonsteroidals because of his prior history of nephrectomy. He states Tylenol has not helped control his pain. Denies abdominal pain.  Frequently wakes up at night with pain.  Patient smokes less than one half pack cigarettes per day. He has strong interest in trying to quit. His mother has adenocarcinoma of the lung is currently undergoing treatment  Patient is currently unemployed. Denies any illicit drug use history.  Past Medical History:  Diagnosis Date  . Allergy   . GERD (gastroesophageal reflux disease)   . Pyelonephritis 08/25/12  . Renal disorder 08/25/12   hydronephrosis  . 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 KIDNEY CYST REMOVAL  Aug 25 2012  . NEPHRECTOMY    . TONSILLECTOMY      reports that he has been smoking Cigarettes.  He has been smoking about 0.25 packs per day. He has never used smokeless tobacco. He reports that he drinks alcohol. He reports that he does not use drugs. family history includes  Cancer in his mother. Allergies  Allergen Reactions  . Tylenol [Acetaminophen] Nausea And Vomiting     Review of Systems  Constitutional: Negative for fatigue.  Eyes: Negative for visual disturbance.  Respiratory: Negative for cough, chest tightness and shortness of breath.   Cardiovascular: Negative for chest pain, palpitations and leg swelling.  Endocrine: Negative for polydipsia and polyuria.  Genitourinary: Positive for flank pain. Negative for dysuria, frequency and hematuria.  Musculoskeletal: Positive for back pain.  Neurological: Negative for dizziness, syncope, weakness, light-headedness and headaches.       Objective:   Physical Exam  Constitutional: He appears well-developed and well-nourished.  Neck: Neck supple.  Cardiovascular: Normal rate and regular rhythm.   Pulmonary/Chest: Effort normal and breath sounds normal. No respiratory distress. He has no wheezes. He has no rales.  Abdominal: Soft. He exhibits no mass. There is no tenderness. There is no rebound and no guarding.  Musculoskeletal: He exhibits no edema.  Skin: No rash noted.       Assessment:     #1 chronic right flank pain following nephrectomy for hydronephrosis back in 2014. Etiology of pain unclear and we explained this is not typical following nephrectomy  #2 ongoing nicotine use.  Pt states he is motivated to quit.      Plan:     -Smoking cessation discussed -Flu vaccine offered and declined -Obtain lab work with CBC and basic metabolic panel -Consider referral to chronic pain management regarding his chronic right back  pain following nephrectomy 2014 -discussed smoking cessation.  He  Expresses interest in Chantix.  Confirm renal function first.  Kristian Covey MD [facility] at [facility]

## 2016-12-26 ENCOUNTER — Telehealth: Payer: Self-pay | Admitting: Family Medicine

## 2016-12-26 NOTE — Telephone Encounter (Signed)
Pt returned your call concerning lab reults

## 2016-12-26 NOTE — Telephone Encounter (Signed)
Patient is aware of lab results.

## 2016-12-27 ENCOUNTER — Ambulatory Visit: Payer: BLUE CROSS/BLUE SHIELD | Admitting: Family Medicine

## 2017-01-28 ENCOUNTER — Emergency Department (HOSPITAL_COMMUNITY)
Admission: EM | Admit: 2017-01-28 | Discharge: 2017-01-28 | Discharge status: Against Medical Advice | Payer: BLUE CROSS/BLUE SHIELD

## 2017-01-28 ENCOUNTER — Other Ambulatory Visit: Payer: Self-pay

## 2017-01-28 ENCOUNTER — Ambulatory Visit (INDEPENDENT_AMBULATORY_CARE_PROVIDER_SITE_OTHER): Payer: BLUE CROSS/BLUE SHIELD | Admitting: Family Medicine

## 2017-01-28 ENCOUNTER — Encounter: Payer: Self-pay | Admitting: Family Medicine

## 2017-01-28 ENCOUNTER — Telehealth: Payer: Self-pay | Admitting: Family Medicine

## 2017-01-28 VITALS — BP 100/60 | HR 95 | Temp 98.5°F | Ht 69.0 in | Wt 237.5 lb

## 2017-01-28 DIAGNOSIS — M79642 Pain in left hand: Secondary | ICD-10-CM | POA: Diagnosis not present

## 2017-01-28 DIAGNOSIS — T63444A Toxic effect of venom of bees, undetermined, initial encounter: Secondary | ICD-10-CM | POA: Diagnosis not present

## 2017-01-28 NOTE — ED Notes (Signed)
Patient called to be triage x 3 and no answer.

## 2017-01-28 NOTE — Telephone Encounter (Signed)
Pts mother is calling back very upset b/c pt was not given any medication for getting stung by a group of wasp and he has tried OTC medications before coming and mother state that the pt has only one kidney and his R hand is swollen and need something stronger than the OTC medication.  Mother also state that she pays too much money for insurance for the pt to only come here to give that kind of advise.  Mother state that they will be going to the ER and if something happens to the pt [place] will be hearing from her attorney.  She would like for Dr. Caryl Never to know this and to get things straighten out.

## 2017-01-28 NOTE — Telephone Encounter (Signed)
Per Dr. Selena Batten pt was treated with routine standard of care for insect bites/stings. Pt had no s/s of a systemic reaction or any other significant complications resulting from the stings. She deemed it medically appropriate for pt to treat symptoms with OTC antihistamines and analgesics. She advised pt that if he felt his pain was significant enough to warrant additional treatment or if his condition worsened he could seek care at UC/ED for treatment, in addition he can follow up with his PCP, Dr. Caryl Never. Pt was clearly not satisfied with this outcome. Before pt left the exam room his mother, [name], called the front office demanding to speak with Dr. Selena Batten regarding the pt's treatment. At that time the mother was not on the [facility] and she was informed we could not speak with her regarding pt. She advised staff that she would have pt complete a DPR as he was checking out. Pt came to check out window and was on the phone with someone and complaining very loudly that we would not give him any pain medication for his bee stings. Pt also advised check out staff that he felt the level of care was unacceptable and without pain medication he would not be able to go to work tomorrow. He was again advised that he could follow up with his PCP or go to UC/ED. Pt refused to schedule follow up.  Shortly after pt left the office his mother called again demanding to speak to Dr. Selena Batten or Dr. Caryl Never regarding the pt's treatment. She was very rude, again, on the phone with the office staff. She was advised that at this time the providers were unavailable to speak with her. She left a detailed message that includes threatening to involve her attorney "if something happens to my son".  I spoke with Dr. Selena Batten and Dr. Caryl Never directly. Dr. Selena Batten advised as to the above statement and reiterated that pt could seek additional care and treatment at ED if he felt it necessary.   I attempted to contact the mother to advise of Dr.  Elmyra Ricks recommendations. The phone went to voicemail and the recording states it is the vm for a totally different phone number and did not state a name. No message was left.  [name] - FYI. Thanks!

## 2017-01-28 NOTE — Progress Notes (Signed)
HPI:  Acute visit for:  Bee stings: -reports stung 6-7 times yesterday - hand/back/head -has itchy tender small areas of swelling at each sting site -no sob, wheezing, systemic symptoms, no hx severe allergic rx  As discussing treatment of mild local reaction to bee sting he said "you are not going to give me a "pain pill"? I reported that would not be part of the usual tx for this. Then, he said he has L hand pain since 2002, punched a wall. Reports seen in ER before for this. Wants "pain pill rx" for this. Reports has some swelling there today. Reports never had xray for this.   ROS: See pertinent positives and negatives per HPI.  Past Medical History:  Diagnosis Date  . Allergy   . GERD (gastroesophageal reflux disease)   . Pyelonephritis 08/25/12  . Renal disorder 08/25/12   hydronephrosis  . Stricture of pelviureteric junction 08/25/12    Past Surgical History:  Procedure Laterality Date  . KIDNEY CYST REMOVAL  Aug 25 2012  . NEPHRECTOMY    . TONSILLECTOMY      Family History  Problem Relation Age of Onset  . Cancer Mother        lung, stage IV    Social History   Social History  . Marital status: Single    Spouse name: N/A  . Number of children: N/A  . Years of education: N/A   Social History Main Topics  . Smoking status: Current Every Day Smoker    Packs/day: 0.25    Types: Cigarettes  . Smokeless tobacco: Never Used  . Alcohol use Yes     Comment: occasionally  . Drug use: No  . Sexual activity: Not Asked   Other Topics Concern  . None   Social History Narrative  . None     Current Outpatient Prescriptions:  .  lansoprazole (PREVACID) 30 MG capsule, Take 1 capsule (30 mg total) by mouth daily at 12 noon., Disp: 14 capsule, Rfl: 0 .  oxyCODONE (ROXICODONE) 5 MG immediate release tablet, Take 1 tablet (5 mg total) by mouth every 4 (four) hours as needed for severe pain., Disp: 10 tablet, Rfl: 0 .  traMADol (ULTRAM) 50 MG tablet, Take 1 tablet  (50 mg total) by mouth every 6 (six) hours as needed., Disp: 15 tablet, Rfl: 0 .  varenicline (CHANTIX CONTINUING MONTH PAK) 1 MG tablet, Take 1 tablet (1 mg total) by mouth 2 (two) times daily., Disp: 60 tablet, Rfl: 1 .  varenicline (CHANTIX STARTING MONTH PAK) 0.5 MG X 11 & 1 MG X 42 tablet, Take one 0.5 mg tablet by mouth once daily for 3 days, then increase to one 0.5 mg tablet twice daily for 4 days, then increase to one 1 mg tablet twice daily., Disp: 53 tablet, Rfl: 0  EXAM:  Vitals:   01/28/17 1632  BP: 100/60  Pulse: 95  Temp: 98.5 F (36.9 C)  SpO2: 98%    Body mass index is 35.07 kg/m.  GENERAL: vitals reviewed and listed above, alert, oriented, appears well hydrated and in no acute distress  HEENT: atraumatic, conjunttiva clear, no obvious abnormalities on inspection of external nose and ears  NECK: no obvious masses on inspection  LUNGS: clear to auscultation bilaterally, no wheezes, rales or rhonchi, good air movement  CV: HRRR, no peripheral edema  MS: moves all extremities without noticeable abnormality - TTP L hand primarily at site of bee sting L dorsal hand  SKIN: several erythematous papules  1x left hand, several scalp, one upper R back  PSYCH: pleasant and cooperative, no obvious depression or anxiety  ASSESSMENT AND PLAN:  Discussed the following assessment and plan:  Bee sting, undetermined intent, initial encounter  Left hand pain - Plan: DG Hand Complete Left  -xray L hand as he reports never done in the past and follow up with pcp for long term reported pain here if persists after local bee sting reaction subsides - advised AGAINST opoid pain medication for chronic pain due to risks and can worsen pain, also on review of chart ? Drug seeking - did advise other options for pain and for MS pain - advised PCP of encounter -advised antihistamine topical treatments for what appears to be mild local reactions to the bee stings - advised of  signs/symptoms severe allergic reaction or systemic reaction and emergency precautions should these ever occur -Patient advised to return or notify a doctor immediately if symptoms worsen or persist or new concerns arise.  Patient Instructions  BEFORE YOU LEAVE: -follow up: with PCP in 1-2 weeks -xray sheet  Take an antihistamine such as zyrtec daily for 1-2 weeks  Topical anti-itch creams for stings as needed  Get the xray of the hand - aleve would probably be the best for this type of pain - take per instructions as needed  Seek emergency care if trouble breathing, severe allergic reaction  I hope you are feeling better soon! Seek care immediately if worsening, new concerns or you are not improving with treatment.   Insect Bite, Adult An insect bite can make your skin red, itchy, and swollen. An insect bite is different from an insect sting, which happens when an insect injects poison (venom) into the skin. Some insects can spread disease to people through a bite. However, most insect bites do not lead to disease and are not serious. What are the causes? Insects may bite for a variety of reasons, including:  Hunger.  To defend themselves.  Insects that bite include:  Spiders.  Mosquitoes.  Ticks.  Fleas.  Ants.  Flies.  Bedbugs.  What are the signs or symptoms? Symptoms of this condition include:  Itching or pain in the bite area.  Redness and swelling in the bite area.  An open wound (skin ulcer).  In many cases, symptoms last for 2-4 days. How is this diagnosed? This condition is usually diagnosed based on symptoms and a physical exam. How is this treated? Treatment is usually not needed. Symptoms often go away on their own. When treatment is recommended, it may involve:  Applying a cream or lotion to the bitten area. This treatment helps with itching.  Taking an antibiotic medicine. This treatment is needed if the bite area gets infected.  Getting  a tetanus shot.  Applying ice to the affected area.  Medicines called antihistamines. This treatment is needed if you develop an allergic reaction to the insect bite.  Follow these instructions at home: Bite area care  Do not scratch the bite area.  Keep the bite area clean and dry. Wash it every day with soap and water as told by your health care provider.  Check the bite area every day for signs of infection. Check for: ? More redness, swelling, or pain. ? Fluid or blood. ? Warmth. ? Pus. Managing pain, itching, and swelling   You may apply a baking soda paste, cortisone cream, or calamine lotion to the bite area as told by your health care provider.  If  directed, applyice to the bite area. ? Put ice in a plastic bag. ? Place a towel between your skin and the bag. ? Leave the ice on for 20 minutes, 2-3 times per day. Medicines  Apply or take over-the-counter and prescription medicines only as told by your health care provider.  If you were prescribed an antibiotic medicine, use it as told by your health care provider. Do not stop using the antibiotic even if your condition improves. General instructions  Keep all follow-up visits as told by your health care provider. This is important. How is this prevented? To help reduce your risk of insect bites:  When you are outdoors, wear clothing that covers your arms and legs.  Use insect repellent. The best insect repellents contain: ? DEET, picaridin, oil of lemon eucalyptus (OLE), or IR3535. ? Higher amounts of an active ingredient.  If your home windows do not have screens, consider installing them.  Contact a health care provider if:  You have more redness, swelling, or pain in the bite area.  You have fluid, blood, or pus coming from the bite area.  The bite area feels warm to the touch.  You have a fever. Get help right away if:  You have joint pain.  You have a rash.  You have shortness of breath.  You  feel unusually tired or sleepy.  You have neck pain.  You have a headache.  You have unusual weakness.  You have chest pain.  You have nausea, vomiting, or pain in the abdomen. This information is not intended to replace advice given to you by your health care provider. Make sure you discuss any questions you have with your health care provider. Document Released: 05/16/2004 Document Revised: 12/06/2015 Document Reviewed: 10/16/2015 Elsevier Interactive Patient Education  [address].    Alden, Dahlia Client R., DO

## 2017-01-28 NOTE — Patient Instructions (Signed)
BEFORE YOU LEAVE: -follow up: with PCP in 1-2 weeks -xray sheet  Take an antihistamine such as zyrtec daily for 1-2 weeks  Topical anti-itch creams for stings as needed  Get the xray of the hand - aleve would probably be the best for this type of pain - take per instructions as needed  Seek emergency care if trouble breathing, severe allergic reaction  I hope you are feeling better soon! Seek care immediately if worsening, new concerns or you are not improving with treatment.   Insect Bite, Adult An insect bite can make your skin red, itchy, and swollen. An insect bite is different from an insect sting, which happens when an insect injects poison (venom) into the skin. Some insects can spread disease to people through a bite. However, most insect bites do not lead to disease and are not serious. What are the causes? Insects may bite for a variety of reasons, including:  Hunger.  To defend themselves.  Insects that bite include:  Spiders.  Mosquitoes.  Ticks.  Fleas.  Ants.  Flies.  Bedbugs.  What are the signs or symptoms? Symptoms of this condition include:  Itching or pain in the bite area.  Redness and swelling in the bite area.  An open wound (skin ulcer).  In many cases, symptoms last for 2-4 days. How is this diagnosed? This condition is usually diagnosed based on symptoms and a physical exam. How is this treated? Treatment is usually not needed. Symptoms often go away on their own. When treatment is recommended, it may involve:  Applying a cream or lotion to the bitten area. This treatment helps with itching.  Taking an antibiotic medicine. This treatment is needed if the bite area gets infected.  Getting a tetanus shot.  Applying ice to the affected area.  Medicines called antihistamines. This treatment is needed if you develop an allergic reaction to the insect bite.  Follow these instructions at home: Bite area care  Do not scratch the  bite area.  Keep the bite area clean and dry. Wash it every day with soap and water as told by your health care provider.  Check the bite area every day for signs of infection. Check for: ? More redness, swelling, or pain. ? Fluid or blood. ? Warmth. ? Pus. Managing pain, itching, and swelling   You may apply a baking soda paste, cortisone cream, or calamine lotion to the bite area as told by your health care provider.  If directed, applyice to the bite area. ? Put ice in a plastic bag. ? Place a towel between your skin and the bag. ? Leave the ice on for 20 minutes, 2-3 times per day. Medicines  Apply or take over-the-counter and prescription medicines only as told by your health care provider.  If you were prescribed an antibiotic medicine, use it as told by your health care provider. Do not stop using the antibiotic even if your condition improves. General instructions  Keep all follow-up visits as told by your health care provider. This is important. How is this prevented? To help reduce your risk of insect bites:  When you are outdoors, wear clothing that covers your arms and legs.  Use insect repellent. The best insect repellents contain: ? DEET, picaridin, oil of lemon eucalyptus (OLE), or IR3535. ? Higher amounts of an active ingredient.  If your home windows do not have screens, consider installing them.  Contact a health care provider if:  You have more redness, swelling, or  pain in the bite area.  You have fluid, blood, or pus coming from the bite area.  The bite area feels warm to the touch.  You have a fever. Get help right away if:  You have joint pain.  You have a rash.  You have shortness of breath.  You feel unusually tired or sleepy.  You have neck pain.  You have a headache.  You have unusual weakness.  You have chest pain.  You have nausea, vomiting, or pain in the abdomen. This information is not intended to replace advice given  to you by your health care provider. Make sure you discuss any questions you have with your health care provider. Document Released: 05/16/2004 Document Revised: 12/06/2015 Document Reviewed: 10/16/2015 Elsevier Interactive Patient Education  [employer].

## 2017-01-28 NOTE — Telephone Encounter (Signed)
ATC pt's mother. The vm recording said it was for a different phone number and there was no name. Unable to leave message.

## 2017-02-05 ENCOUNTER — Ambulatory Visit: Payer: BLUE CROSS/BLUE SHIELD | Admitting: Family Medicine

## 2017-02-05 DIAGNOSIS — Z0289 Encounter for other administrative examinations: Secondary | ICD-10-CM

## 2017-02-07 ENCOUNTER — Ambulatory Visit (INDEPENDENT_AMBULATORY_CARE_PROVIDER_SITE_OTHER): Payer: BLUE CROSS/BLUE SHIELD | Admitting: Family Medicine

## 2017-02-07 ENCOUNTER — Encounter: Payer: Self-pay | Admitting: Family Medicine

## 2017-02-07 VITALS — BP 110/80 | HR 91 | Temp 98.6°F | Wt 236.4 lb

## 2017-02-07 DIAGNOSIS — K219 Gastro-esophageal reflux disease without esophagitis: Secondary | ICD-10-CM | POA: Diagnosis not present

## 2017-02-07 DIAGNOSIS — R1310 Dysphagia, unspecified: Secondary | ICD-10-CM

## 2017-02-07 MED ORDER — PANTOPRAZOLE SODIUM 40 MG PO TBEC: 40.0000 mg | DELAYED_RELEASE_TABLET | Freq: Every day | ORAL | 3 refills | Status: DC

## 2017-02-07 MED ORDER — SUCRALFATE 1 GM/10ML PO SUSP: 1.0000 g | Freq: Three times a day (TID) | ORAL | 0 refills | Status: DC

## 2017-02-07 NOTE — Progress Notes (Signed)
Subjective:     Patient ID: Kyle Crane, male   DOB: [birth year], 31 y.o.   MRN: [id number]  HPI Patient seen with pain with swallowing with onset last night. He ate a fairly heavy meal with steak sandwich with peppers and onions. He developed some burning sensation from his epigastric region up toward the mid esophagus. Had some pain with swallowing today. No true dysphagia otherwise by history. He states had some more frequent reflux symptoms recently. No history of thrush. He did start vape yesterday but does not think this was related. He has no cough. No pleuritic pain. No recent vomiting. No melena. Denies nonsteroidal use. No regular alcohol use. He took [place] with essentially no improvement.  Past Medical History:  Diagnosis Date  . Allergy   . GERD (gastroesophageal reflux disease)   . Pyelonephritis 08/25/12  . Renal disorder 08/25/12   hydronephrosis  . 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 KIDNEY CYST REMOVAL  Aug 25 2012  . NEPHRECTOMY    . TONSILLECTOMY      reports that he has been smoking Cigarettes.  He has been smoking about 0.25 packs per day. He has never used smokeless tobacco. He reports that he drinks alcohol. He reports that he does not use drugs. family history includes Cancer in his mother. Allergies  Allergen Reactions  . Tylenol [Acetaminophen] Nausea And Vomiting  \   Review of Systems  Constitutional: Negative for appetite change and unexpected weight change.  HENT: Positive for trouble swallowing. Negative for congestion and sore throat.   Respiratory: Negative for cough and shortness of breath.   Cardiovascular: Negative for chest pain.  Gastrointestinal: Negative for abdominal distention, blood in stool, diarrhea, nausea and vomiting.       Objective:   Physical Exam  Constitutional: He appears well-developed and well-nourished.  HENT:  Mouth/Throat: Oropharynx is clear and moist.   Neck: Neck supple. No thyromegaly present.  Cardiovascular: Normal rate and regular rhythm.   Pulmonary/Chest: Effort normal and breath sounds normal. No respiratory distress. He has no wheezes. He has no rales.  Abdominal: Soft. Bowel sounds are normal. He exhibits no distension and no mass. There is no tenderness. There is no rebound and no guarding.  Lymphadenopathy:    He has no cervical adenopathy.       Assessment:     Odynophagia in a patient with history of GERD. Relatively acute onset. Suspect probably reflux related. Question reflux esophagitis    Plan:     -Handout on GERD given and we discussed dietary modification -Avoid eating within 2-3 hours of bedtime -Consider elevate head of bed 6 inches -Start Protonix 40 mg once daily -Carafate 1 g per 10 mL 10 mL by mouth 4 times daily -Touch base by next week if not greatly improving. We recommended 6-8 weeks on Protonix -Consider GI referral for further evaluation not improving next week  Kristian CoveyBruce W Mcgwire Dasaro MD [facility]

## 2017-02-07 NOTE — Patient Instructions (Addendum)
Food Choices for Gastroesophageal Reflux Disease, Adult When you have gastroesophageal reflux disease (GERD), the foods you eat and your eating habits are very important. Choosing the right foods can help ease the discomfort of GERD. Consider working with a diet and nutrition specialist (dietitian) to help you make healthy food choices. What general guidelines should I follow? Eating plan  Choose healthy foods low in fat, such as fruits, vegetables, whole grains, low-fat dairy products, and lean meat, fish, and poultry.  Eat frequent, small meals instead of three large meals each day. Eat your meals slowly, in a relaxed setting. Avoid bending over or lying down until 2-3 hours after eating.  Limit high-fat foods such as fatty meats or fried foods.  Limit your intake of oils, butter, and shortening to less than 8 teaspoons each day.  Avoid the following: ? Foods that cause symptoms. These may be different for different people. Keep a food diary to keep track of foods that cause symptoms. ? Alcohol. ? Drinking large amounts of liquid with meals. ? Eating meals during the 2-3 hours before bed.  Cook foods using methods other than frying. This may include baking, grilling, or broiling. Lifestyle   Maintain a healthy weight. Ask your health care provider what weight is healthy for you. If you need to lose weight, work with your health care provider to do so safely.  Exercise for at least 30 minutes on 5 or more days each week, or as told by your health care provider.  Avoid wearing clothes that fit tightly around your waist and chest.  Do not use any products that contain nicotine or tobacco, such as cigarettes and e-cigarettes. If you need help quitting, ask your health care provider.  Sleep with the head of your bed raised. Use a wedge under the mattress or blocks under the bed frame to raise the head of the bed. What foods are not recommended? The items listed may not be a complete  list. Talk with your dietitian about what dietary choices are best for you. Grains Pastries or quick breads with added fat. French toast. Vegetables Deep fried vegetables. French fries. Any vegetables prepared with added fat. Any vegetables that cause symptoms. For some people this may include tomatoes and tomato products, chili peppers, onions and garlic, and horseradish. Fruits Any fruits prepared with added fat. Any fruits that cause symptoms. For some people this may include citrus fruits, such as oranges, grapefruit, pineapple, and lemons. Meats and other protein foods High-fat meats, such as fatty beef or pork, hot dogs, ribs, ham, sausage, salami and bacon. Fried meat or protein, including fried fish and fried chicken. Nuts and nut butters. Dairy Whole milk and chocolate milk. Sour cream. Cream. Ice cream. Cream cheese. Milk shakes. Beverages Coffee and tea, with or without caffeine. Carbonated beverages. Sodas. Energy drinks. Fruit juice made with acidic fruits (such as [place] or grapefruit). Tomato juice. Alcoholic drinks. Fats and oils Butter. Margarine. Shortening. Ghee. Sweets and desserts Chocolate and cocoa. Donuts. Seasoning and other foods Pepper. Peppermint and spearmint. Any condiments, herbs, or seasonings that cause symptoms. For some people, this may include curry, hot sauce, or vinegar-based salad dressings. Summary  When you have gastroesophageal reflux disease (GERD), food and lifestyle choices are very important to help ease the discomfort of GERD.  Eat frequent, small meals instead of three large meals each day. Eat your meals slowly, in a relaxed setting. Avoid bending over or lying down until 2-3 hours after eating.  Limit high-fat   foods such as fatty meat or fried foods. This information is not intended to replace advice given to you by your health care provider. Make sure you discuss any questions you have with your health care provider. Document Released:  04/08/2005 Document Revised: 04/09/2016 Document Reviewed: 04/09/2016 Elsevier Interactive Patient Education  2017 [employer]  Let know by next week if no better If better, would take the Protonix for at least 6-8 weeks.

## 2017-02-09 ENCOUNTER — Encounter (HOSPITAL_COMMUNITY): Payer: Self-pay | Admitting: Emergency Medicine

## 2017-02-09 ENCOUNTER — Emergency Department (HOSPITAL_COMMUNITY)
Admission: EM | Admit: 2017-02-09 | Discharge: 2017-02-09 | Disposition: A | Discharge status: Home / Self Care | Payer: BLUE CROSS/BLUE SHIELD | Attending: Emergency Medicine | Admitting: Emergency Medicine

## 2017-02-09 ENCOUNTER — Emergency Department (HOSPITAL_COMMUNITY): Payer: BLUE CROSS/BLUE SHIELD

## 2017-02-09 DIAGNOSIS — Z79899 Other long term (current) drug therapy: Secondary | ICD-10-CM | POA: Diagnosis not present

## 2017-02-09 DIAGNOSIS — R079 Chest pain, unspecified: Secondary | ICD-10-CM | POA: Diagnosis not present

## 2017-02-09 DIAGNOSIS — F17210 Nicotine dependence, cigarettes, uncomplicated: Secondary | ICD-10-CM | POA: Diagnosis not present

## 2017-02-09 DIAGNOSIS — K228 Other specified diseases of esophagus: Secondary | ICD-10-CM | POA: Insufficient documentation

## 2017-02-09 DIAGNOSIS — R09A2 Foreign body sensation, throat: Secondary | ICD-10-CM

## 2017-02-09 DIAGNOSIS — R198 Other specified symptoms and signs involving the digestive system and abdomen: Secondary | ICD-10-CM

## 2017-02-09 DIAGNOSIS — K2100 Gastro-esophageal reflux disease with esophagitis, without bleeding: Secondary | ICD-10-CM

## 2017-02-09 LAB — CBC
HCT: 41.1 % (ref 39.0–52.0)
HEMOGLOBIN: 13.7 g/dL (ref 13.0–17.0)
MCH: 25.3 pg — ABNORMAL LOW (ref 26.0–34.0)
MCHC: 33.3 g/dL (ref 30.0–36.0)
MCV: 75.8 fL — ABNORMAL LOW (ref 78.0–100.0)
Platelets: 185 10*3/uL (ref 150–400)
RBC: 5.42 MIL/uL (ref 4.22–5.81)
RDW: 14.9 % (ref 11.5–15.5)
WBC: 5.8 10*3/uL (ref 4.0–10.5)

## 2017-02-09 LAB — BASIC METABOLIC PANEL
ANION GAP: 9 (ref 5–15)
BUN: 19 mg/dL (ref 6–20)
CALCIUM: 8.7 mg/dL — AB (ref 8.9–10.3)
CO2: 24 mmol/L (ref 22–32)
Chloride: 104 mmol/L (ref 101–111)
Creatinine, Ser: 1.42 mg/dL — ABNORMAL HIGH (ref 0.61–1.24)
GFR calc non Af Amer: >60 mL/min (ref >60)
Glucose, Bld: 110 mg/dL — ABNORMAL HIGH (ref 65–99)
Potassium: 3.8 mmol/L (ref 3.5–5.1)
SODIUM: 137 mmol/L (ref 135–145)

## 2017-02-09 LAB — I-STAT TROPONIN, ED: TROPONIN I, POC: 0 ng/mL (ref 0.00–0.08)

## 2017-02-09 MED ORDER — FAMOTIDINE 20 MG PO TABS: 20.0000 mg | ORAL_TABLET | Freq: Two times a day (BID) | ORAL | 0 refills | Status: DC | PRN

## 2017-02-09 MED ORDER — GLUCAGON HCL RDNA (DIAGNOSTIC) 1 MG IJ SOLR
1.0000 mg | Freq: Once | INTRAMUSCULAR | Status: AC
  Administered 2017-02-09: 1 mg via INTRAVENOUS
  Filled 2017-02-09: qty 1

## 2017-02-09 MED ORDER — GI COCKTAIL ~~LOC~~
30.0000 mL | Freq: Once | ORAL | Status: AC
  Administered 2017-02-09: 30 mL via ORAL
  Filled 2017-02-09: qty 30

## 2017-02-09 MED ORDER — PANTOPRAZOLE SODIUM 40 MG IV SOLR
40.0000 mg | Freq: Once | INTRAVENOUS | Status: AC
  Administered 2017-02-09: 40 mg via INTRAVENOUS
  Filled 2017-02-09: qty 40

## 2017-02-09 MED ORDER — SODIUM CHLORIDE 0.9 % IV BOLUS (SEPSIS)
1000.0000 mL | Freq: Once | INTRAVENOUS | Status: AC
  Administered 2017-02-09: 1000 mL via INTRAVENOUS

## 2017-02-09 MED ORDER — METOCLOPRAMIDE HCL 5 MG/ML IJ SOLN
10.0000 mg | Freq: Once | INTRAMUSCULAR | Status: AC
  Administered 2017-02-09: 10 mg via INTRAVENOUS
  Filled 2017-02-09: qty 2

## 2017-02-09 MED ORDER — FAMOTIDINE IN NACL 20-0.9 MG/50ML-% IV SOLN
20.0000 mg | Freq: Once | INTRAVENOUS | Status: AC
  Administered 2017-02-09: 20 mg via INTRAVENOUS
  Filled 2017-02-09: qty 50

## 2017-02-09 MED ORDER — DIPHENHYDRAMINE HCL 50 MG/ML IJ SOLN
12.5000 mg | Freq: Once | INTRAMUSCULAR | Status: AC
  Administered 2017-02-09: 12.5 mg via INTRAVENOUS
  Filled 2017-02-09: qty 1

## 2017-02-09 NOTE — Discharge Instructions (Signed)
Continue protonix and carafate as prescribed by your doctor.   Take pepcid as needed if you have persistent chest discomfort.   Liquid diet for 2-3 days then you can get soft foods.   You had a GI referral by your doctor. Call his office tomorrow to inquire about the referral   Return to ER if you are unable to keep anything down, vomiting, fever, trouble breathing, severe chest pain

## 2017-02-09 NOTE — ED Provider Notes (Signed)
[facility] EMERGENCY DEPARTMENT Provider Note   CSN: [id number] Arrival date & time: 02/09/17  4098     History   Chief Complaint Chief Complaint  Patient presents with  . difficulty swallowing  . Chest Pain    HPI Kyle Crane is a 31 y.o. male history of reflux he had presented with trouble swallowing.  Patient states that he ate a sandwich 3 days ago and felt that something may have stuck in his esophagus.  He went to his primary care doctor 3 days ago and was thought to have reflux at that time and was started on Protonix, Carafate but he has not been taking yet.  Patient also was referred to gastroenterology outpatient but has not seen them.  Patient states that over the last 2 days, he still has a foreign body sensation in his esophagus.  However, patient states that he was able to drink fluids and keep down some soup.  He also denies persistent drooling.  Patient never had an endoscopy before and never had any known strictures in his esophagus or food impaction previously.  The history is provided by the patient.    Past Medical History:  Diagnosis Date  . Allergy   . GERD (gastroesophageal reflux disease)   . Pyelonephritis 08/25/12  . Renal disorder 08/25/12   hydronephrosis  . Stricture of pelviureteric junction 08/25/12    Patient Active Problem List   Diagnosis Date Noted  . History of unilateral nephrectomy 12/24/2016    Past Surgical History:  Procedure Laterality Date  . KIDNEY CYST REMOVAL  Aug 25 2012  . NEPHRECTOMY    . TONSILLECTOMY         Home Medications    Prior to Admission medications   Medication Sig Start Date End Date Taking? Authorizing Provider  pantoprazole (PROTONIX) 40 MG tablet Take 1 tablet (40 mg total) by mouth daily. 02/07/17  Yes Burchette, Elberta Fortis, MD  sucralfate (CARAFATE) 1 GM/10ML suspension Take 10 mLs (1 g total) by mouth 4 (four) times daily -  with meals and at bedtime. 02/07/17  Yes Burchette,  Elberta Fortis, MD  varenicline (CHANTIX CONTINUING MONTH PAK) 1 MG tablet Take 1 tablet (1 mg total) by mouth 2 (two) times daily. Patient not taking: Reported on 02/07/2017 12/24/16   Kristian Covey, MD  varenicline (CHANTIX STARTING MONTH PAK) 0.5 MG X 11 & 1 MG X 42 tablet Take one 0.5 mg tablet by mouth once daily for 3 days, then increase to one 0.5 mg tablet twice daily for 4 days, then increase to one 1 mg tablet twice daily. Patient not taking: Reported on 02/07/2017 12/24/16   Kristian Covey, MD    Family History Family History  Problem Relation Age of Onset  . Cancer Mother        lung, stage IV    Social History Social History  Substance Use Topics  . Smoking status: Current Every Day Smoker    Packs/day: 0.25    Types: Cigarettes  . Smokeless tobacco: Never Used  . Alcohol use Yes     Comment: occasionally     Allergies   Tylenol [acetaminophen]   Review of Systems Review of Systems  Respiratory: Positive for chest tightness.   Cardiovascular: Positive for chest pain.  All other systems reviewed and are negative.    Physical Exam Updated Vital Signs BP 122/70   Pulse 71   Temp 97.9 F (36.6 C) (Oral)   Resp (!) 23  Ht 5\' 9"  (1.753 m)   Wt 107 kg (236 lb)   SpO2 100%   BMI 34.85 kg/m   Physical Exam  Constitutional: He is oriented to person, place, and time. He appears well-developed and well-nourished.  HENT:  Head: Normocephalic.  MM dry. No obvious foreign body posterior pharynx   Eyes: Pupils are equal, round, and reactive to light. Conjunctivae and EOM are normal.  Neck: Normal range of motion. Neck supple.  No stridor   Cardiovascular: Normal rate, regular rhythm and normal heart sounds.   Pulmonary/Chest: Effort normal and breath sounds normal. No respiratory distress. He has no wheezes.  Abdominal: Soft. Bowel sounds are normal. He exhibits no distension and no mass. There is no tenderness. There is no guarding.  Musculoskeletal: Normal  range of motion. He exhibits no edema or deformity.  Neurological: He is alert and oriented to person, place, and time. He displays normal reflexes. No cranial nerve deficit. Coordination normal.  Skin: Skin is warm.  Psychiatric: He has a normal mood and affect.  Nursing note and vitals reviewed.    ED Treatments / Results  Labs (all labs ordered are listed, but only abnormal results are displayed) Labs Reviewed  BASIC METABOLIC PANEL - Abnormal; Notable for the following:       Result Value   Glucose, Bld 110 (*)    Creatinine, Ser 1.42 (*)    Calcium 8.7 (*)    All other components within normal limits  CBC - Abnormal; Notable for the following:    MCV 75.8 (*)    MCH 25.3 (*)    All other components within normal limits  I-STAT TROPONIN, ED    EKG  EKG Interpretation  Date/Time:  Sunday February 09 2017 06:29:12 EDT Ventricular Rate:  79 PR Interval:  134 QRS Duration: 98 QT Interval:  392 QTC Calculation: 449 R Axis:   73 Text Interpretation:  Normal sinus rhythm Normal ECG No significant change since last tracing Confirmed by Zadie RhineWickline, Donald ([id number]) on 02/09/2017 6:34:35 AM       Radiology Dg Chest 2 View  Result Date: 02/09/2017 CLINICAL DATA:  Chest pain for 2 days. Feels like something is stuck in the esophagus making it difficult to swallow. EXAM: CHEST  2 VIEW COMPARISON:  10/31/2013 FINDINGS: Normal heart size and pulmonary vascularity. No focal airspace disease or consolidation in the lungs. No blunting of costophrenic angles. No pneumothorax. Mediastinal contours appear intact. No gaseous distention of esophagus noted on chest radiograph. IMPRESSION: No active cardiopulmonary disease. Electronically Signed   By: Burman NievesWilliam  Stevens M.D.   On: 02/09/2017 06:47    Procedures Procedures (including critical care time)  Medications Ordered in ED Medications  sodium chloride 0.9 % bolus 1,000 mL (not administered)  glucagon (human recombinant) (GLUCAGEN)  injection 1 mg (not administered)  metoCLOPramide (REGLAN) injection 10 mg (not administered)  diphenhydrAMINE (BENADRYL) injection 12.5 mg (not administered)  famotidine (PEPCID) IVPB 20 mg premix (not administered)  pantoprazole (PROTONIX) injection 40 mg (not administered)     Initial Impression / Assessment and Plan / ED Course  I have reviewed the triage vital signs and the nursing notes.  Pertinent labs & imaging results that were available during my care of the patient were reviewed by me and considered in my medical decision making (see chart for details).     Nicola GirtRyan Macari is a 31 y.o. male here presenting with foreign body sensation in his mid chest after eating a sandwich 3 days ago.  Patient is not drooling and is able to keep down fluids.  Patient actually saw primary care doctor 3 days and was thought to have reflux and was referred for endoscopy outpatient.  I think likely reflux or esophageal strictures.  He is tolerating his secretions well right now so we will get a chest x-ray, labs, give glucagon and Reglan and Protonix and Pepcid and GI cocktail and reassess.  9:26 AM Labs showed Cr 1.4, slightly elevated likely from dehydration. CXR showed no obvious dilated esophagus. Given meds and GI cocktail and able to tolerate PO. Has nexium, carafate at home. Has GI referral placed by PCP. Continue liquid diet for several days, can get outpatient endoscopy with GI.   Final Clinical Impressions(s) / ED Diagnoses   Final diagnoses:  None    New Prescriptions New Prescriptions   No medications on file     Charlynne Pander, MD 02/09/17 [phone]

## 2017-02-09 NOTE — ED Triage Notes (Signed)
Reports eating a sub sandwich on Friday and since feeling as if something is stuck in his esophagus making it difficult to swallow.  Reports pain rated at 8/10 in center of chest.

## 2017-02-12 ENCOUNTER — Telehealth: Payer: Self-pay | Admitting: Family Medicine

## 2017-02-12 NOTE — Telephone Encounter (Signed)
Received prior authorization for Carafate. The prior authorization has been submitted to the insurance company via covermymeds. Please allow 5-7 business days for a decision to be made. Most insurance companies respond quicker than this, it all depends on their process. The key to covermymeds is [id number].

## 2017-02-17 NOTE — Telephone Encounter (Signed)
PA approved. Form faxed back to pharmacy. 

## 2017-03-27 ENCOUNTER — Ambulatory Visit (HOSPITAL_COMMUNITY): Payer: BLUE CROSS/BLUE SHIELD | Admitting: Psychiatry

## 2017-05-14 ENCOUNTER — Encounter: Payer: Self-pay | Admitting: Family Medicine

## 2017-05-14 ENCOUNTER — Ambulatory Visit (INDEPENDENT_AMBULATORY_CARE_PROVIDER_SITE_OTHER): Payer: BLUE CROSS/BLUE SHIELD | Admitting: Family Medicine

## 2017-05-14 VITALS — BP 130/60 | HR 93 | Temp 98.6°F | Ht 69.0 in | Wt 239.0 lb

## 2017-05-14 DIAGNOSIS — Z23 Encounter for immunization: Secondary | ICD-10-CM

## 2017-05-14 DIAGNOSIS — Z0000 Encounter for general adult medical examination without abnormal findings: Secondary | ICD-10-CM | POA: Diagnosis not present

## 2017-05-14 NOTE — Progress Notes (Signed)
Subjective:     Patient ID: Kyle Crane, male   DOB: July 12, 1985, 32 y.o.   MRN: [id number]  HPI Patient seen for physical exam. He has ongoing nicotine use but only smokes currently about 3 cigarettes per day. He's had previous nephrectomy reportedly secondary to hydronephrosis from ? pelviureteric stenosis. Not sure if this was congenital.  Last tetanus was over 10 years ago. He declines flu vaccine. He went to the ER back in October and had chemistries with creatinine up to 1.42 which is above his baseline. Not checked since then. Avoids regular use of nonsteroidals.  Past Medical History:  Diagnosis Date  . Allergy   . GERD (gastroesophageal reflux disease)   . Pyelonephritis 08/25/12  . Renal disorder 08/25/12   hydronephrosis  . 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 KIDNEY CYST REMOVAL  Aug 25 2012  . NEPHRECTOMY    . TONSILLECTOMY      reports that he has been smoking cigarettes.  He has been smoking about 0.25 packs per day. he has never used smokeless tobacco. He reports that he drinks alcohol. He reports that he does not use drugs. family history includes Cancer in his mother. Allergies  Allergen Reactions  . Tylenol [Acetaminophen] Nausea And Vomiting     Review of Systems  Constitutional: Negative for activity change, appetite change, fatigue and fever.  HENT: Negative for congestion, ear pain and trouble swallowing.   Eyes: Negative for pain and visual disturbance.  Respiratory: Negative for cough, shortness of breath and wheezing.   Cardiovascular: Negative for chest pain and palpitations.  Gastrointestinal: Negative for abdominal distention, abdominal pain, blood in stool, constipation, diarrhea, nausea, rectal pain and vomiting.  Genitourinary: Negative for dysuria, hematuria and testicular pain.  Musculoskeletal: Negative for arthralgias and joint swelling.  Skin: Negative for rash.  Neurological:  Negative for dizziness, syncope and headaches.  Hematological: Negative for adenopathy.  Psychiatric/Behavioral: Negative for confusion and dysphoric mood.       Objective:   Physical Exam  Constitutional: He is oriented to person, place, and time. He appears well-developed and well-nourished. No distress.  HENT:  Head: Normocephalic and atraumatic.  Right Ear: External ear normal.  Left Ear: External ear normal.  Mouth/Throat: Oropharynx is clear and moist.  Eyes: Conjunctivae and EOM are normal. Pupils are equal, round, and reactive to light.  Neck: Normal range of motion. Neck supple. No thyromegaly present.  Cardiovascular: Normal rate, regular rhythm and normal heart sounds.  No murmur heard. Pulmonary/Chest: No respiratory distress. He has no wheezes. He has no rales.  Abdominal: Soft. Bowel sounds are normal. He exhibits no distension and no mass. There is no tenderness. There is no rebound and no guarding.  Musculoskeletal: He exhibits no edema.  Lymphadenopathy:    He has no cervical adenopathy.  Neurological: He is alert and oriented to person, place, and time. He displays normal reflexes. No cranial nerve deficit.  Skin: No rash noted.  Psychiatric: He has a normal mood and affect.       Assessment:     Physical exam. The following issues were addressed    Plan:     -Flu vaccine recommended and declined -Tetanus booster given -Obtain screening lab work -Instructed to avoid all nonsteroidals with his previous nephrectomy -Smoking cessation discussed.  Kristian CoveyBruce W Burchette MD [facility]

## 2017-05-15 LAB — HEPATIC FUNCTION PANEL
ALBUMIN: 4.7 g/dL (ref 3.5–5.2)
ALK PHOS: 54 U/L (ref 39–117)
ALT: 16 U/L (ref 0–53)
AST: 14 U/L (ref 0–37)
Bilirubin, Direct: 0.1 mg/dL (ref 0.0–0.3)
TOTAL PROTEIN: 7.8 g/dL (ref 6.0–8.3)
Total Bilirubin: 0.6 mg/dL (ref 0.2–1.2)

## 2017-05-15 LAB — CBC WITH DIFFERENTIAL/PLATELET
BASOS PCT: 2.4 % (ref 0.0–3.0)
Basophils Absolute: 0.1 10*3/uL (ref 0.0–0.1)
EOS PCT: 1 % (ref 0.0–5.0)
Eosinophils Absolute: 0.1 10*3/uL (ref 0.0–0.7)
HCT: 44 % (ref 39.0–52.0)
Hemoglobin: 14.3 g/dL (ref 13.0–17.0)
Lymphocytes Relative: 40.5 % (ref 12.0–46.0)
Lymphs Abs: 2.4 10*3/uL (ref 0.7–4.0)
MCHC: 32.5 g/dL (ref 30.0–36.0)
MCV: 76.6 fl — AB (ref 78.0–100.0)
MONOS PCT: 9.8 % (ref 3.0–12.0)
Monocytes Absolute: 0.6 10*3/uL (ref 0.1–1.0)
NEUTROS ABS: 2.8 10*3/uL (ref 1.4–7.7)
NEUTROS PCT: 46.3 % (ref 43.0–77.0)
Platelets: 198 10*3/uL (ref 150.0–400.0)
RBC: 5.74 Mil/uL (ref 4.22–5.81)
RDW: 15.9 % — ABNORMAL HIGH (ref 11.5–15.5)
WBC: 6 10*3/uL (ref 4.0–10.5)

## 2017-05-15 LAB — LIPID PANEL
CHOLESTEROL: 178 mg/dL (ref 0–200)
HDL: 42.2 mg/dL (ref >39.00)
LDL Cholesterol: 112 mg/dL — ABNORMAL HIGH (ref 0–99)
NONHDL: 135.76
TRIGLYCERIDES: 121 mg/dL (ref 0.0–149.0)
Total CHOL/HDL Ratio: 4
VLDL: 24.2 mg/dL (ref 0.0–40.0)

## 2017-05-15 LAB — BASIC METABOLIC PANEL
BUN: 24 mg/dL — ABNORMAL HIGH (ref 6–23)
CALCIUM: 9.7 mg/dL (ref 8.4–10.5)
CO2: 26 mEq/L (ref 19–32)
Chloride: 104 mEq/L (ref 96–112)
Creatinine, Ser: 1.32 mg/dL (ref 0.40–1.50)
GFR: 81.47 mL/min (ref >60.00)
Glucose, Bld: 92 mg/dL (ref 70–99)
Potassium: 4.2 mEq/L (ref 3.5–5.1)
SODIUM: 139 meq/L (ref 135–145)

## 2017-05-15 LAB — TSH: TSH: 0.82 u[IU]/mL (ref 0.35–4.50)

## 2017-06-23 DIAGNOSIS — R079 Chest pain, unspecified: Secondary | ICD-10-CM | POA: Diagnosis present

## 2017-06-23 DIAGNOSIS — F17210 Nicotine dependence, cigarettes, uncomplicated: Secondary | ICD-10-CM | POA: Insufficient documentation

## 2017-06-23 DIAGNOSIS — K219 Gastro-esophageal reflux disease without esophagitis: Secondary | ICD-10-CM | POA: Diagnosis not present

## 2017-06-24 ENCOUNTER — Encounter (HOSPITAL_COMMUNITY): Payer: Self-pay | Admitting: Emergency Medicine

## 2017-06-24 ENCOUNTER — Emergency Department (HOSPITAL_COMMUNITY): Payer: BLUE CROSS/BLUE SHIELD

## 2017-06-24 ENCOUNTER — Other Ambulatory Visit: Payer: Self-pay

## 2017-06-24 ENCOUNTER — Emergency Department (HOSPITAL_COMMUNITY)
Admission: EM | Admit: 2017-06-24 | Discharge: 2017-06-24 | Disposition: A | Discharge status: Home / Self Care | Payer: BLUE CROSS/BLUE SHIELD | Attending: Emergency Medicine | Admitting: Emergency Medicine

## 2017-06-24 DIAGNOSIS — K219 Gastro-esophageal reflux disease without esophagitis: Secondary | ICD-10-CM

## 2017-06-24 DIAGNOSIS — R0789 Other chest pain: Secondary | ICD-10-CM

## 2017-06-24 LAB — BASIC METABOLIC PANEL
Anion gap: 8 (ref 5–15)
BUN: 16 mg/dL (ref 6–20)
CHLORIDE: 107 mmol/L (ref 101–111)
CO2: 23 mmol/L (ref 22–32)
CREATININE: 1.2 mg/dL (ref 0.61–1.24)
Calcium: 9 mg/dL (ref 8.9–10.3)
GFR calc Af Amer: >60 mL/min (ref >60)
GFR calc non Af Amer: >60 mL/min (ref >60)
Glucose, Bld: 106 mg/dL — ABNORMAL HIGH (ref 65–99)
POTASSIUM: 3.6 mmol/L (ref 3.5–5.1)
Sodium: 138 mmol/L (ref 135–145)

## 2017-06-24 LAB — RAPID STREP SCREEN (MED CTR MEBANE ONLY): STREPTOCOCCUS, GROUP A SCREEN (DIRECT): NEGATIVE

## 2017-06-24 LAB — TROPONIN I: Troponin I: <0.03 ng/mL (ref <0.03)

## 2017-06-24 LAB — CBC
HEMATOCRIT: 40.6 % (ref 39.0–52.0)
Hemoglobin: 13.3 g/dL (ref 13.0–17.0)
MCH: 24.9 pg — AB (ref 26.0–34.0)
MCHC: 32.8 g/dL (ref 30.0–36.0)
MCV: 76 fL — AB (ref 78.0–100.0)
Platelets: 185 10*3/uL (ref 150–400)
RBC: 5.34 MIL/uL (ref 4.22–5.81)
RDW: 15.1 % (ref 11.5–15.5)
WBC: 6.5 10*3/uL (ref 4.0–10.5)

## 2017-06-24 MED ORDER — FAMOTIDINE 20 MG PO TABS
20.0000 mg | ORAL_TABLET | Freq: Once | ORAL | Status: AC
  Administered 2017-06-24: 20 mg via ORAL
  Filled 2017-06-24: qty 1

## 2017-06-24 MED ORDER — FAMOTIDINE 20 MG PO TABS: 20.0000 mg | ORAL_TABLET | Freq: Two times a day (BID) | ORAL | 0 refills | Status: AC | PRN

## 2017-06-24 MED ORDER — GI COCKTAIL ~~LOC~~
30.0000 mL | Freq: Once | ORAL | Status: AC
  Administered 2017-06-24: 30 mL via ORAL
  Filled 2017-06-24: qty 30

## 2017-06-24 MED ORDER — SUCRALFATE 1 G PO TABS: 1.0000 g | ORAL_TABLET | Freq: Three times a day (TID) | ORAL | 0 refills | Status: AC

## 2017-06-24 MED ORDER — PANTOPRAZOLE SODIUM 40 MG PO TBEC: 40.0000 mg | DELAYED_RELEASE_TABLET | Freq: Every day | ORAL | 3 refills | Status: AC

## 2017-06-24 NOTE — ED Triage Notes (Signed)
Pt reports sore throat along with epigastric pain and burning in chest. Pt states pain started yesterday and worse when swallowing.

## 2017-06-24 NOTE — Discharge Instructions (Signed)
1. Medications: Carafate for symptomatic treatment, Pepcid for burning chest pain, Protonix, usual home medications 2. Treatment: rest, drink plenty of fluids, advance diet slowly 3. Follow Up: Please followup with your primary doctor in 3-5 days for discussion of your diagnoses and further evaluation after today's visit; if you do not have a primary care doctor use the resource guide provided to find one; Please return to the ER for persistent vomiting, high fevers or worsening symptoms

## 2017-06-24 NOTE — ED Provider Notes (Signed)
[facility]-EMERGENCY DEPT Provider Note   CSN: [id number] Arrival date & time: 06/23/17  2328     History   Chief Complaint Chief Complaint  Patient presents with  . Sore Throat  . Chest Pain    HPI Kyle Crane is a 32 y.o. male with a hx of ADD, GERD presents to the Emergency Department complaining of gradual, persistent, progressively worsening ST and burning chest pain onset mid January but worsening yesterday. Associated symptoms include belching, feeling of fullness, acid wash.  Nothing makes it better and laying flat, eating spicy foods and acidic foods makes it worse.  Pt reports he has had this previously, but the prescribed medication which improved the symptoms has run out.  He does not remember what he was prescribed.  No regular NSAID usage or alcohol usage however, patient is a current smoker.  Pt denies fever, chills, headache, neck pain, SOB, vomiting, diarrhea, weakness, dizziness, syncope, dysuria.  Patient denies recent illness or sick contacts.      Record review shows pt was dx with GERD in Oct 2018.  He was d/c home with Pepcid.    The history is provided by the patient and medical records. No language interpreter was used.    Past Medical History:  Diagnosis Date  . Allergy   . GERD (gastroesophageal reflux disease)   . Pyelonephritis 08/25/12  . Renal disorder 08/25/12   hydronephrosis  . Stricture of pelviureteric junction 08/25/12    Patient Active Problem List   Diagnosis Date Noted  . History of unilateral nephrectomy 12/24/2016    Past Surgical History:  Procedure Laterality Date  . KIDNEY CYST REMOVAL  Aug 25 2012  . NEPHRECTOMY    . TONSILLECTOMY         Home Medications    Prior to Admission medications   Medication Sig Start Date End Date Taking? Authorizing Provider  lisdexamfetamine (VYVANSE) 50 MG capsule Take 50 mg by mouth daily.   Yes [provider]  famotidine (PEPCID) 20 MG tablet Take 1  tablet (20 mg total) by mouth 2 (two) times daily as needed for heartburn or indigestion. 06/24/17   Latora Quarry, Dahlia Client, PA-C  pantoprazole (PROTONIX) 40 MG tablet Take 1 tablet (40 mg total) by mouth daily. 06/24/17   Itzae Mccurdy, Dahlia Client, PA-C  sucralfate (CARAFATE) 1 g tablet Take 1 tablet (1 g total) by mouth 4 (four) times daily -  with meals and at bedtime. 06/24/17   Damarkus Balis, Dahlia Client, PA-C    Family History Family History  Problem Relation Age of Onset  . Cancer Mother        lung, stage IV    Social History Social History   Tobacco Use  . Smoking status: Current Every Day Smoker    Packs/day: 0.25    Types: Cigarettes  . Smokeless tobacco: Never Used  Substance Use Topics  . Alcohol use: Yes    Comment: occasionally  . Drug use: No     Allergies   Tylenol [acetaminophen]   Review of Systems Review of Systems  Constitutional: Negative for appetite change, diaphoresis, fatigue, fever and unexpected weight change.  HENT: Positive for sore throat. Negative for mouth sores.   Eyes: Negative for visual disturbance.  Respiratory: Negative for cough, chest tightness, shortness of breath and wheezing.   Cardiovascular: Positive for chest pain.  Gastrointestinal: Negative for abdominal pain, constipation, diarrhea, nausea and vomiting.  Endocrine: Negative for polydipsia, polyphagia and polyuria.  Genitourinary: Negative for dysuria, frequency, hematuria and  urgency.  Musculoskeletal: Negative for back pain and neck stiffness.  Skin: Negative for rash.  Allergic/Immunologic: Negative for immunocompromised state.  Neurological: Negative for syncope, light-headedness and headaches.  Hematological: Does not bruise/bleed easily.  Psychiatric/Behavioral: Negative for sleep disturbance. The patient is not nervous/anxious.      Physical Exam Updated Vital Signs BP (!) 125/95 (BP Location: Left Arm)   Pulse 68   Temp 98.2 F (36.8 C) (Oral)   Resp 16   Ht 5\' 9"  (1.753  m)   Wt 102.1 kg (225 lb)   SpO2 100%   BMI 33.23 kg/m   Physical Exam  Constitutional: He appears well-developed and well-nourished. No distress.  Awake, alert, nontoxic appearance  HENT:  Head: Normocephalic and atraumatic.  Mouth/Throat: Oropharynx is clear and moist. No oropharyngeal exudate.  Eyes: Conjunctivae are normal. No scleral icterus.  Neck: Normal range of motion. Neck supple.  Cardiovascular: Normal rate, regular rhythm and intact distal pulses.  Pulmonary/Chest: Effort normal and breath sounds normal. No respiratory distress. He has no wheezes.  Equal chest expansion  Abdominal: Soft. Bowel sounds are normal. He exhibits no mass. There is no tenderness. There is no rebound and no guarding.  Musculoskeletal: Normal range of motion. He exhibits no edema.  Neurological: He is alert.  Speech is clear and goal oriented Moves extremities without ataxia  Skin: Skin is warm and dry. He is not diaphoretic.  Psychiatric: He has a normal mood and affect.  Nursing note and vitals reviewed.    ED Treatments / Results  Labs (all labs ordered are listed, but only abnormal results are displayed) Labs Reviewed  BASIC METABOLIC PANEL - Abnormal; Notable for the following components:      Result Value   Glucose, Bld 106 (*)    All other components within normal limits  CBC - Abnormal; Notable for the following components:   MCV 76.0 (*)    MCH 24.9 (*)    All other components within normal limits  RAPID STREP SCREEN (NOT AT [facility])  CULTURE, GROUP A STREP [facility]  TROPONIN I    EKG  EKG Interpretation  Date/Time:  Tuesday June 24 2017 00:13:37 EST Ventricular Rate:  82 PR Interval:    QRS Duration: 101 QT Interval:  374 QTC Calculation: 437 R Axis:   72 Text Interpretation:  Sinus rhythm No significant change was found Confirmed by Glynn Octaveancour, Stephen [phone]([id number]) on 06/24/2017 12:18:25 AM       Radiology Dg Chest 2 View  Result Date: 06/24/2017 CLINICAL DATA:  Chest  pain EXAM: CHEST  2 VIEW COMPARISON:  02/09/2017 FINDINGS: The heart size and mediastinal contours are within normal limits. Both lungs are clear. The visualized skeletal structures are unremarkable. IMPRESSION: No active cardiopulmonary disease. Electronically Signed   By: Deatra RobinsonKevin  Herman M.D.   On: 06/24/2017 01:49    Procedures Procedures (including critical care time)  Medications Ordered in ED Medications  gi cocktail (Maalox,Lidocaine,Donnatal) (30 mLs Oral Given 06/24/17 0617)  famotidine (PEPCID) tablet 20 mg (20 mg Oral Given 06/24/17 0617)     Initial Impression / Assessment and Plan / ED Course  I have reviewed the triage vital signs and the nursing notes.  Pertinent labs & imaging results that were available during my care of the patient were reviewed by me and considered in my medical decision making (see chart for details).     Patient presents with sore throat and chest pain.  No evidence of influenza.  Vital signs are within normal  limits.  Symptoms most consistent with acid reflux.  Patient has a history of same and is not currently being medicated.  Patient given GI cocktail and Pepcid here in the emergency department with significant relief of his symptoms.  Will discharge home with Pepcid, Protonix and Carafate.  Patient is to establish primary care physician for further evaluation and management.  If symptoms do not improve in the next several weeks patient may need follow-up with gastroenterology.  Patient states understanding and is in agreement with this plan.  Final Clinical Impressions(s) / ED Diagnoses   Final diagnoses:  Burning chest pain  Gastroesophageal reflux disease, esophagitis presence not specified    ED Discharge Orders        Ordered    famotidine (PEPCID) 20 MG tablet  2 times daily PRN     06/24/17 0550    pantoprazole (PROTONIX) 40 MG tablet  Daily     06/24/17 0550    sucralfate (CARAFATE) 1 g tablet  3 times daily with meals & bedtime      06/24/17 0550       Zaion Hreha, Dahlia Client, PA-C 06/24/17 1610    Glynn Octave, MD 06/24/17 [phone]

## 2017-06-26 LAB — CULTURE, GROUP A STREP (THRC)

## 2018-04-06 IMAGING — DX DG CHEST 2V
2 series · 2 of 2 positions shown · non-contrast
Comparison: 10/31/2013

CLINICAL DATA: Chest pain for 2 days. Feels like something is stuck
in the esophagus making it difficult to swallow.

EXAM:
CHEST  2 VIEW

[chest pa]
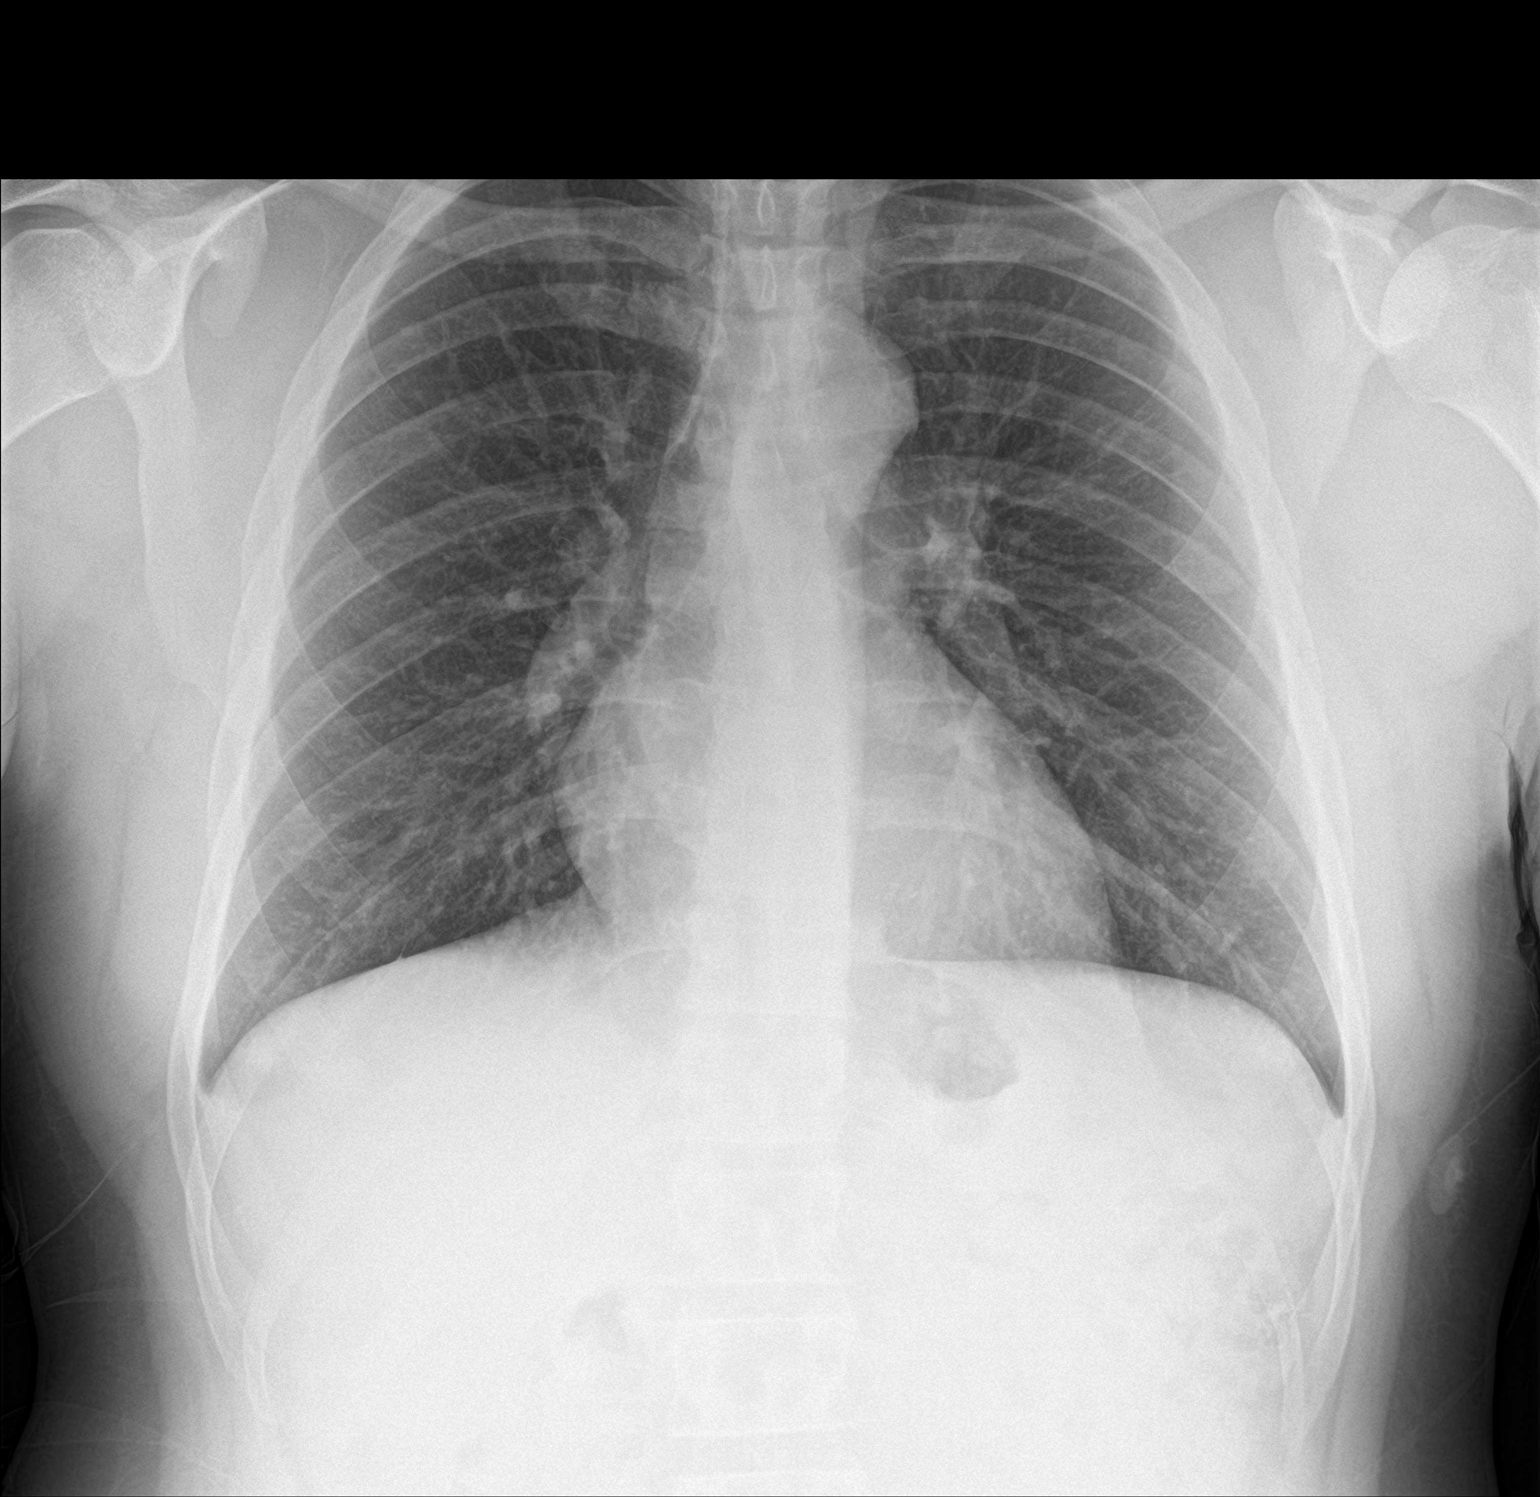

[chest lat]
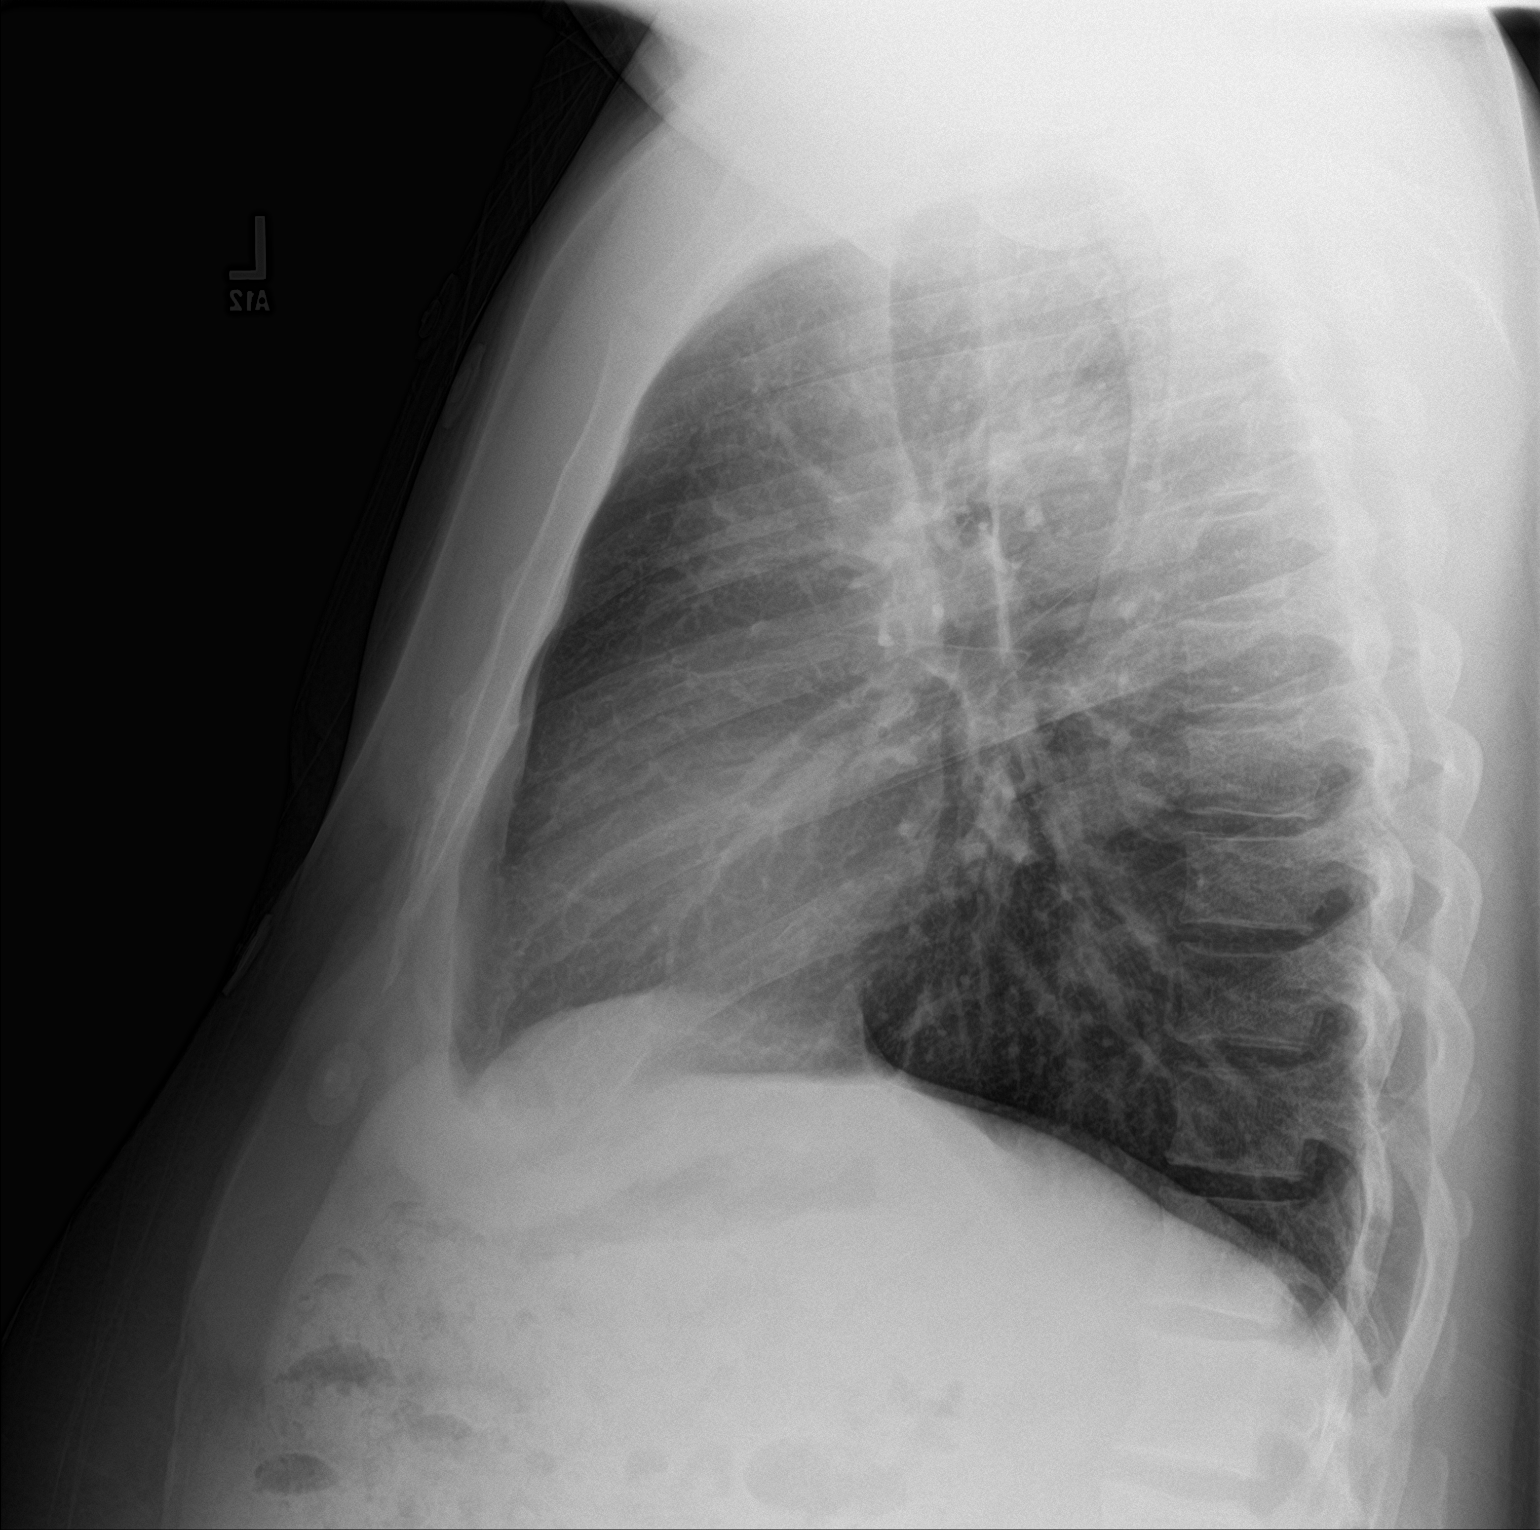

[2 of 2 positions shown; findings below may reference images not displayed]

FINDINGS: Normal heart size and pulmonary vascularity. No focal airspace
disease or consolidation in the lungs. No blunting of costophrenic
angles. No pneumothorax. Mediastinal contours appear intact. No
gaseous distention of esophagus noted on chest radiograph.
IMPRESSION: No active cardiopulmonary disease.

## 2018-11-07 ENCOUNTER — Encounter (HOSPITAL_COMMUNITY): Payer: Self-pay | Admitting: Emergency Medicine

## 2018-11-07 ENCOUNTER — Emergency Department (HOSPITAL_COMMUNITY)
Admission: EM | Admit: 2018-11-07 | Discharge: 2018-11-07 | Disposition: A | Discharge status: Home / Self Care | Payer: BLUE CROSS/BLUE SHIELD | Attending: Emergency Medicine | Admitting: Emergency Medicine

## 2018-11-07 ENCOUNTER — Other Ambulatory Visit: Payer: Self-pay

## 2018-11-07 DIAGNOSIS — F17210 Nicotine dependence, cigarettes, uncomplicated: Secondary | ICD-10-CM | POA: Insufficient documentation

## 2018-11-07 DIAGNOSIS — R350 Frequency of micturition: Secondary | ICD-10-CM | POA: Insufficient documentation

## 2018-11-07 DIAGNOSIS — Z79899 Other long term (current) drug therapy: Secondary | ICD-10-CM | POA: Insufficient documentation

## 2018-11-07 LAB — URINALYSIS, ROUTINE W REFLEX MICROSCOPIC
Bilirubin Urine: NEGATIVE
Glucose, UA: NEGATIVE mg/dL
Hgb urine dipstick: NEGATIVE
Ketones, ur: NEGATIVE mg/dL
Leukocytes,Ua: NEGATIVE
Nitrite: NEGATIVE
Protein, ur: NEGATIVE mg/dL
Specific Gravity, Urine: 1.014 (ref 1.005–1.030)
pH: 6 (ref 5.0–8.0)

## 2018-11-07 NOTE — ED Notes (Signed)
Called lab to add on Chlamydia.

## 2018-11-07 NOTE — ED Notes (Signed)
Patient verbalizes understanding of discharge instructions. Opportunity for questioning and answers were provided. Armband removed by staff, pt discharged from ED.  

## 2018-11-07 NOTE — ED Triage Notes (Signed)
Pt reports he had a uti a few months ago that he was treated for, states that he started feeling like he had another uti a few days ago. He denies blood or any penile discharge, just reports some discomfort in his penis.

## 2018-11-07 NOTE — Discharge Instructions (Addendum)
You were seen in the ED today for urinary frequency We tested your urine; it was negative for a UTI We have sent out testing for gonorrhea, chlamydia, and trichomonas which are STDs. You will receive a call if this is positive.  Please follow up with [facility] for your primary care needs/physical exam

## 2018-11-07 NOTE — ED Provider Notes (Signed)
[facility] EMERGENCY DEPARTMENT Provider Note   CSN: [id number] Arrival date & time: 11/07/18  1022    History   Chief Complaint Chief Complaint  Patient presents with   Recurrent UTI    HPI Kyle Crane is a 33 y.o. male with PMHx renal disorder, pyelo, GERD, PSHx of nephrectomy who presents to the ED today to be checked for a UTI. Pt reports that he had a UTI a few months ago and states he started having similar symptoms about 1 week ago. Pt is currently complaining of urinary frequency only. He is sexually active with 1 male partner in a monogamous relationship although they do not wear protection as they are trying to conceive. Pt denies fever, chills, abdominal pain, nausea, vomiting, diarrhea, flank pain, dysuria, penile discharge, penile pain, testicular pain, testicular swelling.        Past Medical History:  Diagnosis Date   Allergy    GERD (gastroesophageal reflux disease)    Pyelonephritis 08/25/12   Renal disorder 08/25/12   hydronephrosis   Stricture of pelviureteric junction 08/25/12    Patient Active Problem List   Diagnosis Date Noted   History of unilateral nephrectomy 12/24/2016    Past Surgical History:  Procedure Laterality Date   KIDNEY CYST REMOVAL  Aug 25 2012   NEPHRECTOMY     TONSILLECTOMY          Home Medications    Prior to Admission medications   Medication Sig Start Date End Date Taking? Authorizing Provider  famotidine (PEPCID) 20 MG tablet Take 1 tablet (20 mg total) by mouth 2 (two) times daily as needed for heartburn or indigestion. 06/24/17   Muthersbaugh, Jarrett Soho, PA-C  lisdexamfetamine (VYVANSE) 50 MG capsule Take 50 mg by mouth daily.    [provider]  pantoprazole (PROTONIX) 40 MG tablet Take 1 tablet (40 mg total) by mouth daily. 06/24/17   Muthersbaugh, Jarrett Soho, PA-C  sucralfate (CARAFATE) 1 g tablet Take 1 tablet (1 g total) by mouth 4 (four) times daily -  with meals and at  bedtime. 06/24/17   Muthersbaugh, Jarrett Soho, PA-C    Family History Family History  Problem Relation Age of Onset   Cancer Mother        lung, stage IV    Social History Social History   Tobacco Use   Smoking status: Current Every Day Smoker    Packs/day: 0.25    Types: Cigarettes   Smokeless tobacco: Never Used  Substance Use Topics   Alcohol use: Yes    Comment: occasionally   Drug use: No     Allergies   Tylenol [acetaminophen]   Review of Systems Review of Systems  Constitutional: Negative for chills and fever.  Gastrointestinal: Negative for abdominal pain, diarrhea, nausea and vomiting.  Genitourinary: Positive for frequency. Negative for difficulty urinating, discharge, dysuria, flank pain, hematuria, penile pain, penile swelling, scrotal swelling and testicular pain.     Physical Exam Updated Vital Signs BP (!) 151/88 (BP Location: Right Arm)    Pulse (!) 108    Temp 98.2 F (36.8 C) (Oral)    Resp 20    SpO2 96%   Physical Exam Vitals signs and nursing note reviewed.  Constitutional:      Appearance: He is not ill-appearing.  HENT:     Head: Normocephalic and atraumatic.  Eyes:     Conjunctiva/sclera: Conjunctivae normal.  Cardiovascular:     Rate and Rhythm: Normal rate and regular rhythm.  Pulmonary:  Effort: Pulmonary effort is normal.     Breath sounds: Normal breath sounds.  Abdominal:     General: Abdomen is flat.     Tenderness: There is no abdominal tenderness. There is no right CVA tenderness, left CVA tenderness, guarding or rebound.  Skin:    General: Skin is warm and dry.     Coloration: Skin is not jaundiced.  Neurological:     Mental Status: He is alert.      ED Treatments / Results  Labs (all labs ordered are listed, but only abnormal results are displayed) Labs Reviewed  URINALYSIS, ROUTINE W REFLEX MICROSCOPIC - Abnormal; Notable for the following components:      Result Value   Color, Urine STRAW (*)    All other  components within normal limits  URINE CULTURE  GC/CHLAMYDIA PROBE AMP ([place]) NOT AT [facility]    EKG None  Radiology No results found.  Procedures Procedures (including critical care time)  Medications Ordered in ED Medications - No data to display   Initial Impression / Assessment and Plan / ED Course  I have reviewed the triage vital signs and the nursing notes.  Pertinent labs & imaging results that were available during my care of the patient were reviewed by me and considered in my medical decision making (see chart for details).    33 year old male with PSHx nephrectomy right kidney who presents with urinary frequency. No other signs or symptoms including penile discharge, penile pain, testicular pain or swelling, flank pain. Reports hx of UTI in the past and this feels similar. He is not concerned about STDs although if we can test in urine he is not opposed to it. Pt mildly tachy on arrival; decreased to 88 during my exam. Afebrile today. No concern for SIRS or sepsis. Pt without abdominal tenderness or CVA tenderness on exam. Will obtain U/A with gc/chlamydia probe today.   U/A clear; no signs of infection. Will send for culture. Given patient having no other symptoms and is a little hazy on his hx of UTIs (states he was evaluated and given 1 pill without an at home prescription) will also send for culture. Advised pt that he may need to follow up with PCP for physical exam as pt has not had one in quite some time. Will give referral to [facility] today. Pt is in agreement with plan at this time and stable for discharge home.        Final Clinical Impressions(s) / ED Diagnoses   Final diagnoses:  Frequency of urination    ED Discharge Orders    None       Tanda RockersVenter, Mica Releford, PA-C 11/07/18 1600    Terrilee FilesButler, Michael C, MD 11/08/18 [phone]

## 2018-11-08 LAB — URINE CULTURE: Culture: NO GROWTH

## 2018-11-11 LAB — GC/CHLAMYDIA PROBE AMP (~~LOC~~) NOT AT ARMC
Chlamydia: NEGATIVE
Neisseria Gonorrhea: NEGATIVE

## 2019-10-08 ENCOUNTER — Emergency Department (HOSPITAL_COMMUNITY): Payer: Self-pay

## 2019-10-08 ENCOUNTER — Encounter (HOSPITAL_COMMUNITY): Payer: Self-pay | Admitting: Pediatrics

## 2019-10-08 ENCOUNTER — Other Ambulatory Visit: Payer: Self-pay

## 2019-10-08 ENCOUNTER — Emergency Department (HOSPITAL_COMMUNITY)
Admission: EM | Admit: 2019-10-08 | Discharge: 2019-10-08 | Disposition: A | Discharge status: Home / Self Care | Payer: Self-pay | Attending: Emergency Medicine | Admitting: Emergency Medicine

## 2019-10-08 DIAGNOSIS — R0789 Other chest pain: Secondary | ICD-10-CM | POA: Insufficient documentation

## 2019-10-08 DIAGNOSIS — Z905 Acquired absence of kidney: Secondary | ICD-10-CM | POA: Insufficient documentation

## 2019-10-08 DIAGNOSIS — R0602 Shortness of breath: Secondary | ICD-10-CM | POA: Insufficient documentation

## 2019-10-08 DIAGNOSIS — R002 Palpitations: Secondary | ICD-10-CM | POA: Insufficient documentation

## 2019-10-08 DIAGNOSIS — R112 Nausea with vomiting, unspecified: Secondary | ICD-10-CM | POA: Insufficient documentation

## 2019-10-08 DIAGNOSIS — F17210 Nicotine dependence, cigarettes, uncomplicated: Secondary | ICD-10-CM | POA: Insufficient documentation

## 2019-10-08 DIAGNOSIS — Z79899 Other long term (current) drug therapy: Secondary | ICD-10-CM | POA: Insufficient documentation

## 2019-10-08 LAB — BASIC METABOLIC PANEL
Anion gap: 13 (ref 5–15)
BUN: 14 mg/dL (ref 6–20)
CO2: 23 mmol/L (ref 22–32)
Calcium: 9.1 mg/dL (ref 8.9–10.3)
Chloride: 105 mmol/L (ref 98–111)
Creatinine, Ser: 1.58 mg/dL — ABNORMAL HIGH (ref 0.61–1.24)
GFR calc Af Amer: >60 mL/min (ref >60)
GFR calc non Af Amer: 57 mL/min — ABNORMAL LOW (ref >60)
Glucose, Bld: 148 mg/dL — ABNORMAL HIGH (ref 70–99)
Potassium: 3.6 mmol/L (ref 3.5–5.1)
Sodium: 141 mmol/L (ref 135–145)

## 2019-10-08 LAB — TROPONIN I (HIGH SENSITIVITY)
Troponin I (High Sensitivity): 3 ng/L (ref <18)
Troponin I (High Sensitivity): 3 ng/L (ref <18)

## 2019-10-08 LAB — CBG MONITORING, ED: Glucose-Capillary: 185 mg/dL — ABNORMAL HIGH (ref 70–99)

## 2019-10-08 LAB — CBC
HCT: 44.8 % (ref 39.0–52.0)
Hemoglobin: 13.7 g/dL (ref 13.0–17.0)
MCH: 24.2 pg — ABNORMAL LOW (ref 26.0–34.0)
MCHC: 30.6 g/dL (ref 30.0–36.0)
MCV: 79.3 fL — ABNORMAL LOW (ref 80.0–100.0)
Platelets: 276 10*3/uL (ref 150–400)
RBC: 5.65 MIL/uL (ref 4.22–5.81)
RDW: 16 % — ABNORMAL HIGH (ref 11.5–15.5)
WBC: 11.5 10*3/uL — ABNORMAL HIGH (ref 4.0–10.5)
nRBC: 0 % (ref 0.0–0.2)

## 2019-10-08 LAB — D-DIMER, QUANTITATIVE: D-Dimer, Quant: 0.37 ug/mL-FEU (ref 0.00–0.50)

## 2019-10-08 MED ORDER — SODIUM CHLORIDE 0.9% FLUSH: 3.0000 mL | Freq: Once | INTRAVENOUS | Status: DC

## 2019-10-08 MED ORDER — LIDOCAINE VISCOUS HCL 2 % MT SOLN
15.0000 mL | Freq: Once | OROMUCOSAL | Status: AC
  Administered 2019-10-08: 15 mL via ORAL
  Filled 2019-10-08: qty 15

## 2019-10-08 MED ORDER — ALUM & MAG HYDROXIDE-SIMETH 200-200-20 MG/5ML PO SUSP
30.0000 mL | Freq: Once | ORAL | Status: AC
  Administered 2019-10-08: 30 mL via ORAL
  Filled 2019-10-08: qty 30

## 2019-10-08 NOTE — ED Provider Notes (Signed)
[facility] EMERGENCY DEPARTMENT Provider Note   CSN: [id number] Arrival date & time: 10/08/19  8185     History Chief Complaint  Patient presents with  . Near Syncope    Kyle Crane is a 34 y.o. male with past medical history of nephrectomy, tobacco use presents to the ER for evaluation of chest pain.  Onset > 3 hours ago.  Patient states he was asleep and he thinks he had a dream that he was "sinking" he was startled and woke himself up.  He had sudden onset shortness of breath and left-sided chest pain that was sharp upon waking up.  This was nonradiating.  It lasted only a few minutes and instantaneously resolved without intervention.  He had associated palpitations.  No longer having chest pain here.  Feels like his breathing is fine now.  He had one episode of nausea and vomiting upon ER arrival.  He threw up black beans and rice which she states he had for dinner last night.  Reports having bad acid reflux, takes omeprazole for it as needed.  Feels like his chest pain has improved ever since throwing up.  He denies any fevers.  No recent cough.  No current abdominal pain.  No hematemesis.  Denies personal history of CAD or family history of CAD had a young age.  No personal history of blood clots, recent surgery, hormone therapy, prolonged immobilization, leg swelling or calf pain, hemoptysis.  No IVDU. No illicit drug use.   HPI     Past Medical History:  Diagnosis Date  . Allergy   . GERD (gastroesophageal reflux disease)   . Pyelonephritis 08/25/12  . Renal disorder 08/25/12   hydronephrosis  . Stricture of pelviureteric junction 08/25/12    Patient Active Problem List   Diagnosis Date Noted  . History of unilateral nephrectomy 12/24/2016    Past Surgical History:  Procedure Laterality Date  . KIDNEY CYST REMOVAL  Aug 25 2012  . NEPHRECTOMY    . TONSILLECTOMY         Family History  Problem Relation Age of Onset  . Cancer Mother         lung, stage IV    Social History   Tobacco Use  . Smoking status: Current Every Day Smoker    Packs/day: 0.25    Types: Cigarettes  . Smokeless tobacco: Never Used  Substance Use Topics  . Alcohol use: Yes    Comment: occasionally  . Drug use: No    Home Medications Prior to Admission medications   Medication Sig Start Date End Date Taking? Authorizing Provider  calcium carbonate (TUMS - DOSED IN MG ELEMENTAL CALCIUM) 500 MG chewable tablet Chew 1-2 tablets by mouth as needed for indigestion or heartburn.   Yes [provider]  omeprazole (PRILOSEC OTC) 20 MG tablet Take 20 mg by mouth daily.   Yes [provider]  famotidine (PEPCID) 20 MG tablet Take 1 tablet (20 mg total) by mouth 2 (two) times daily as needed for heartburn or indigestion. Patient not taking: Reported on 10/08/2019 06/24/17   Muthersbaugh, Dahlia Client, PA-C  pantoprazole (PROTONIX) 40 MG tablet Take 1 tablet (40 mg total) by mouth daily. Patient not taking: Reported on 10/08/2019 06/24/17   Muthersbaugh, Dahlia Client, PA-C  sucralfate (CARAFATE) 1 g tablet Take 1 tablet (1 g total) by mouth 4 (four) times daily -  with meals and at bedtime. Patient not taking: Reported on 10/08/2019 06/24/17   Muthersbaugh, Dahlia Client, PA-C  Allergies    Tylenol [acetaminophen]  Review of Systems   Review of Systems  Respiratory: Positive for shortness of breath.   Cardiovascular: Positive for chest pain and palpitations.  Gastrointestinal: Positive for nausea and vomiting.  All other systems reviewed and are negative.   Physical Exam Updated Vital Signs BP 110/64   Pulse 98   Temp 98.3 F (36.8 C) (Oral)   Resp 18   Ht 5\' 9"  (1.753 m)   SpO2 96%   BMI 33.23 kg/m   Physical Exam Constitutional:      Appearance: He is well-developed.     Comments: NAD. Non toxic.   HENT:     Head: Normocephalic and atraumatic.     Nose: Nose normal.  Eyes:     General: Lids are normal.     Conjunctiva/sclera: Conjunctivae  normal.  Neck:     Trachea: Trachea normal.     Comments: Trachea midline.  Cardiovascular:     Rate and Rhythm: Normal rate and regular rhythm.     Pulses:          Radial pulses are 1+ on the right side and 1+ on the left side.       Dorsalis pedis pulses are 1+ on the right side and 1+ on the left side.     Heart sounds: Normal heart sounds, S1 normal and S2 normal.     Comments: No murmurs. No LE edema or calf tenderness.  Pulmonary:     Effort: Pulmonary effort is normal.     Breath sounds: Normal breath sounds.  Abdominal:     General: Bowel sounds are normal.     Palpations: Abdomen is soft.     Tenderness: There is no abdominal tenderness.     Comments: No epigastric or upper abdominal tenderness.  Musculoskeletal:     Cervical back: Normal range of motion.  Skin:    General: Skin is warm and dry.     Capillary Refill: Capillary refill takes less than 2 seconds.     Comments: No rash to chest wall  Neurological:     Mental Status: He is alert.     GCS: GCS eye subscore is 4. GCS verbal subscore is 5. GCS motor subscore is 6.     Comments: Sensation and strength intact in upper/lower extremities  Psychiatric:        Speech: Speech normal.        Behavior: Behavior normal.        Thought Content: Thought content normal.     ED Results / Procedures / Treatments   Labs (all labs ordered are listed, but only abnormal results are displayed) Labs Reviewed  BASIC METABOLIC PANEL - Abnormal; Notable for the following components:      Result Value   Glucose, Bld 148 (*)    Creatinine, Ser 1.58 (*)    GFR calc non Af Amer 57 (*)    All other components within normal limits  CBC - Abnormal; Notable for the following components:   WBC 11.5 (*)    MCV 79.3 (*)    MCH 24.2 (*)    RDW 16.0 (*)    All other components within normal limits  CBG MONITORING, ED - Abnormal; Notable for the following components:   Glucose-Capillary 185 (*)    All other components within normal  limits  D-DIMER, QUANTITATIVE (NOT AT [facility])  URINALYSIS, ROUTINE W REFLEX MICROSCOPIC  TROPONIN I (HIGH SENSITIVITY)  TROPONIN I (HIGH SENSITIVITY)  EKG EKG Interpretation  Date/Time:  Friday October 08 2019 09:51:04 EDT Ventricular Rate:  119 PR Interval:  154 QRS Duration: 94 QT Interval:  322 QTC Calculation: 452 R Axis:   55 Text Interpretation: Sinus tachycardia Nonspecific T wave abnormality Abnormal ECG increased rate but nosig change from previous Confirmed by Arby Barrette [id number]) on 10/08/2019 11:37:00 AM   Radiology DG Chest 2 View  Result Date: 10/08/2019 CLINICAL DATA:  Shortness of breath EXAM: CHEST - 2 VIEW COMPARISON:  Prior chest x-ray 06/24/2017 FINDINGS: Low inspiratory volumes. The low inspiratory volumes results in crowding of the pulmonary vasculature. No overt pulmonary edema. Minimal bibasilar atelectasis. No focal airspace consolidation. No pleural effusion or pneumothorax. Cardiac and mediastinal contours appear to be within normal limits. No acute osseous abnormality. IMPRESSION: 1. Very low inspiratory volumes with mild bibasilar atelectasis. 2. Otherwise, no acute cardiopulmonary process. Electronically Signed   By: Malachy Moan M.D.   On: 10/08/2019 10:40    Procedures Procedures (including critical care time)  Medications Ordered in ED Medications  sodium chloride flush (NS) 0.9 % injection 3 mL (has no administration in time range)  alum & mag hydroxide-simeth (MAALOX/MYLANTA) 200-200-20 MG/5ML suspension 30 mL (30 mLs Oral Given 10/08/19 1608)    And  lidocaine (XYLOCAINE) 2 % viscous mouth solution 15 mL (15 mLs Oral Given 10/08/19 1609)    ED Course  I have reviewed the triage vital signs and the nursing notes.  Pertinent labs & imaging results that were available during my care of the patient were reviewed by me and considered in my medical decision making (see chart for details).  Clinical Course as of Oct 08 1955  Fri Oct 08, 2019    1138 Pulse Rate(!): 120 [CG]  1138 BP(!): 169/111 [CG]  1138 Resp(!): 22 [CG]  1138 WBC(!): 11.5 [CG]  1138 Creatinine(!): 1.58 [CG]  1403 Troponin I (High Sensitivity): 3 [CG]  1403 D-Dimer, Quant: 0.37 [CG]  1403 Creatinine(!): 1.58 [CG]  1403 WBC(!): 11.5 [CG]    Clinical Course User Index [CG] Jerrell Mylar   MDM Rules/Calculators/A&P                         34 year old male presents with what sounds like atypical chest pain.  Chest pain onset during sleep.  Chest pain-free in the ER.  Chest pain improved after episode of nausea and vomiting.  It appears like he threw up the dinner he had last night.  This in setting of bad acid reflux.  Chest pain was nonexertional, nonpleuritic.  Nonpositional.  No associated concerning features like fever, cough.  No longer having chest pain or shortness of breath peer.  No pleuritic pain, leg swelling or calf pain to suggest DVT.  Cardiac risk factors include elevated BMI, tobacco use.  Exam is benign.  Initially tachypneic and tachycardic but this improved without intervention.  No reproducible chest wall tenderness.  No epigastric tenderness.  No lower extremity edema or calf tenderness.  No neuro pulse deficits.  ER work-up initiated in triage, personally reviewed.  ER work-up personally visualized, interpreted.  He has mild elevation in creatinine, white count, in setting of nausea and vomiting suspect mild dehydration.  EKG shows nonspecific T wave abnormalities but these are not new.  Troponin x1 is undetectable.  D-dimer is negative.  Given symptomatology, exam, non ischemic cardiac work up in ER and HEART score patient is appropriate for discharge with PCP/cardiology f/u.  Work up  not suggestive of symptomatic anemia, PE, PTX, dissection, ACS.  Likely atypical chest pain, possibly MSK etiology vs pleurisy vs costochondritis vs GI related vs other. ED return preacutions given. Pt appears reliable for follow up, aware of symptoms  that would warrant return to ER.  Pt is comfortable and agreeable with ER POC and discharge plan.   Final Clinical Impression(s) / ED Diagnoses Final diagnoses:  Atypical chest pain    Rx / DC Orders ED Discharge Orders    None       Jerrell Mylar 10/08/19 Brooke Pace    Arby Barrette, MD 10/12/19 [phone]

## 2019-10-08 NOTE — ED Triage Notes (Signed)
Patient c/o "feeling like I'm sinking" ; c/o  shortness of breath. Stated he woke up about an hour ago feeling unwell. Reported hx of just 1 kidney. Denies any drug use.

## 2019-10-08 NOTE — ED Notes (Signed)
PA to room to discuss discharge. Pt unwilling to wait for d/c paperwork. Pt refused dc vital signs. IV removed.

## 2020-12-02 IMAGING — CR DG CHEST 2V
2 series · 2 of 2 positions shown · non-contrast
Comparison: Prior chest x-ray 06/24/2017

CLINICAL DATA: Shortness of breath

EXAM:
CHEST - 2 VIEW

[chest pa]
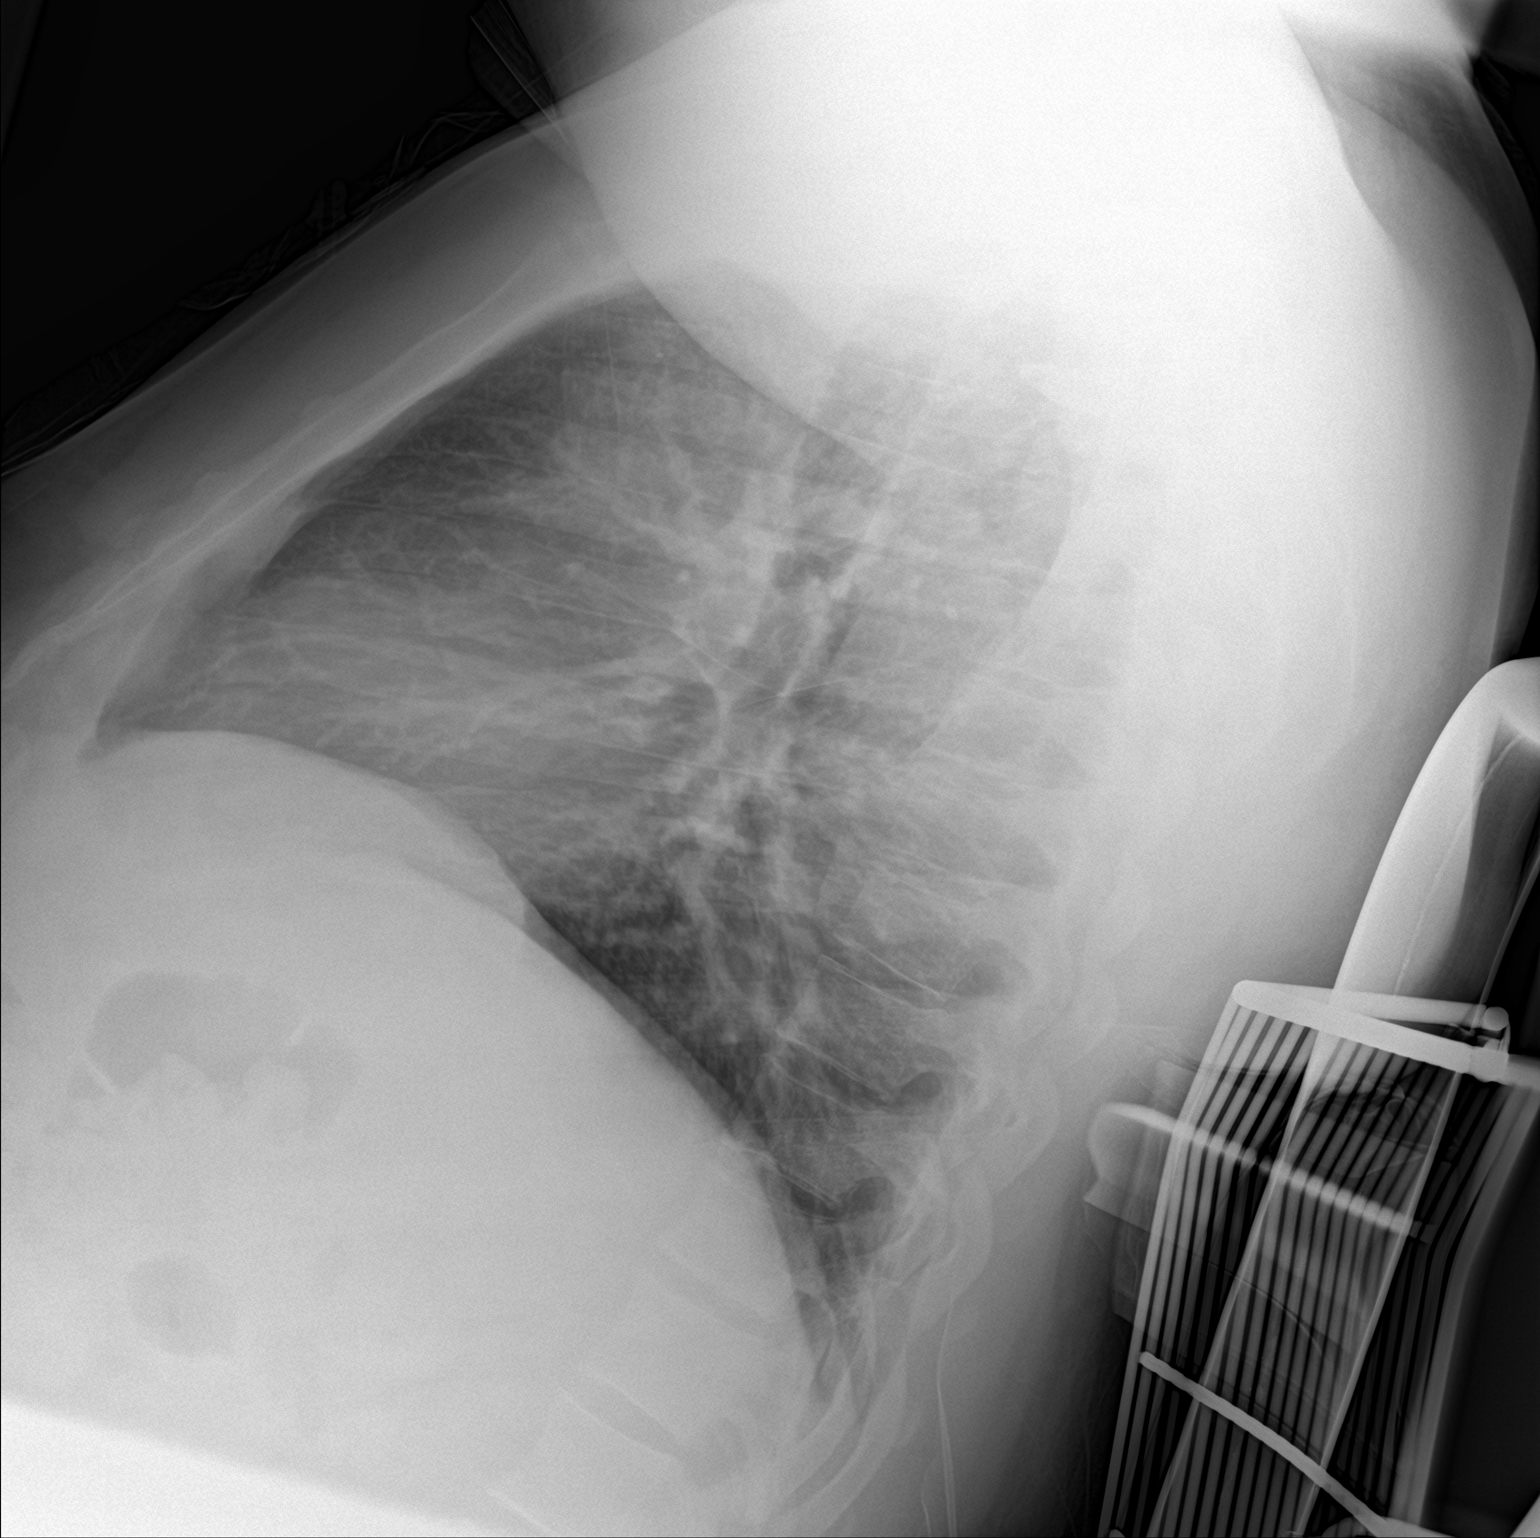

[chest ap]
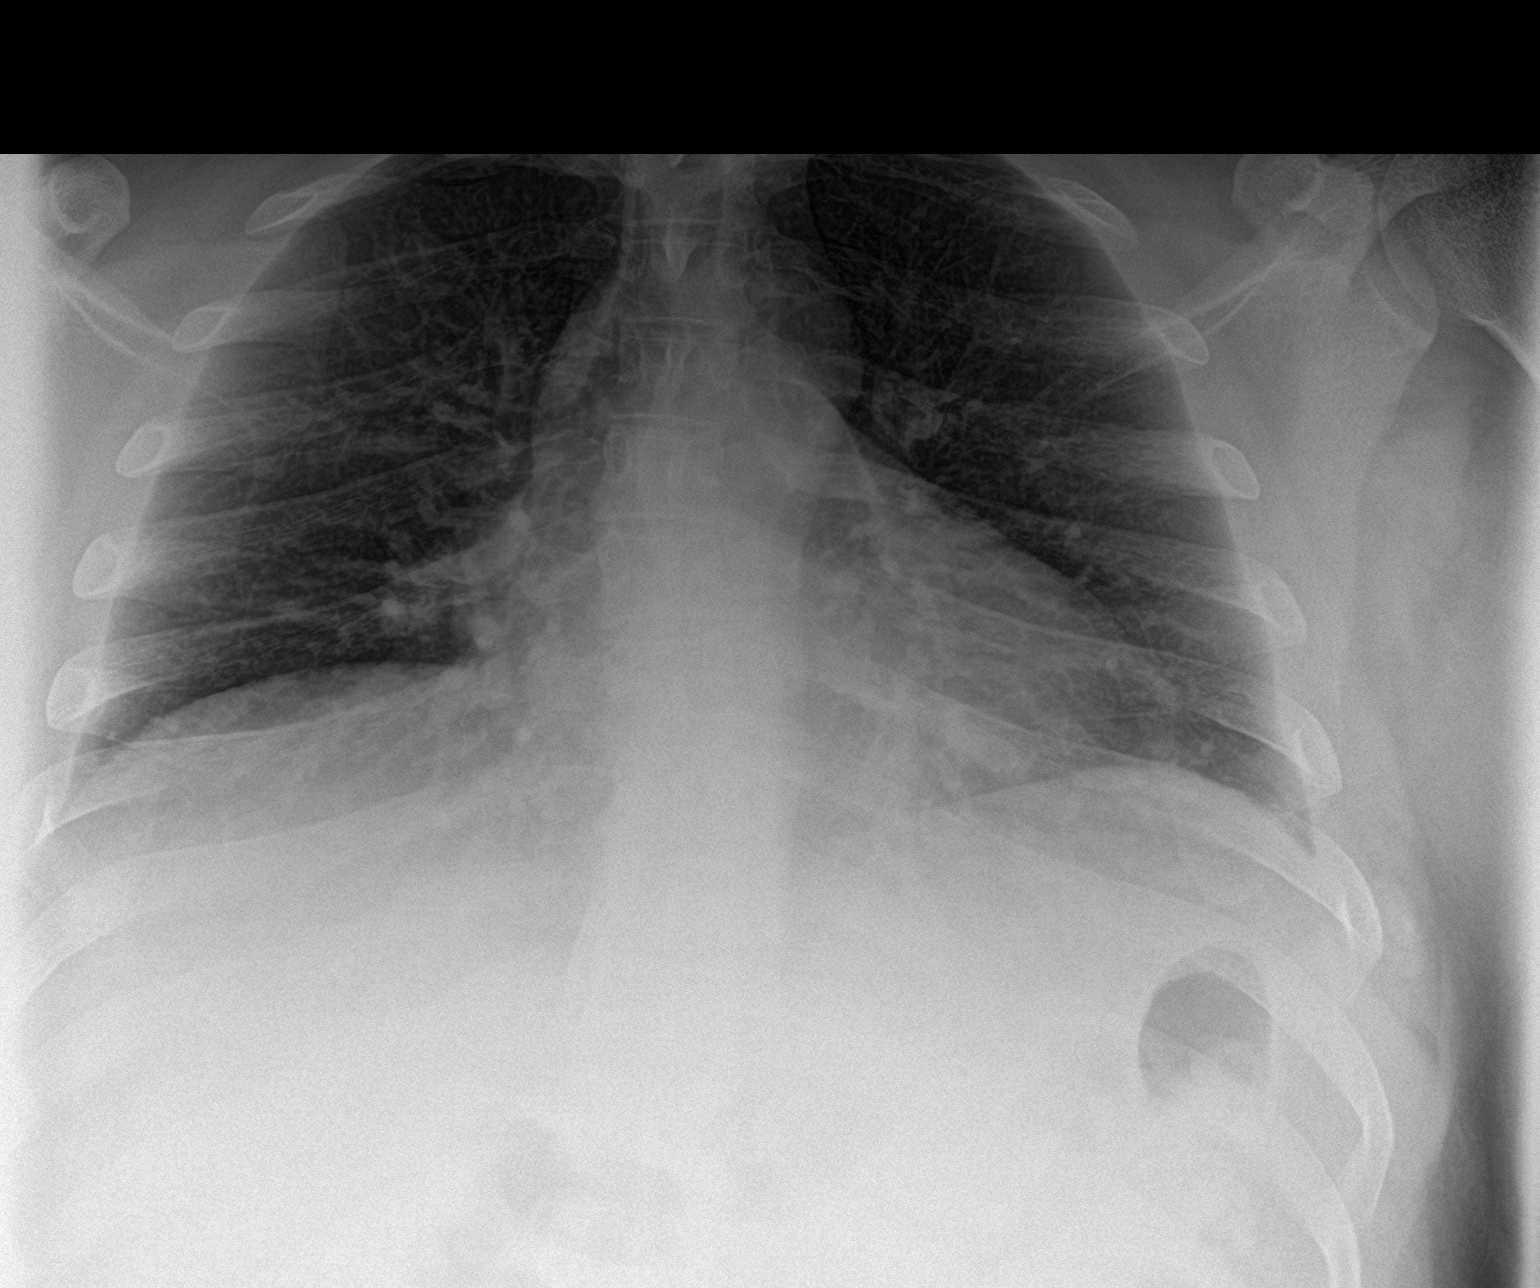

[2 of 2 positions shown; findings below may reference images not displayed]

FINDINGS: Low inspiratory volumes. The low inspiratory volumes results in
crowding of the pulmonary vasculature. No overt pulmonary edema.
Minimal bibasilar atelectasis. No focal airspace consolidation. No
pleural effusion or pneumothorax. Cardiac and mediastinal contours
appear to be within normal limits. No acute osseous abnormality.
IMPRESSION: 1. Very low inspiratory volumes with mild bibasilar atelectasis.
2. Otherwise, no acute cardiopulmonary process.

## 2021-07-16 ENCOUNTER — Emergency Department (HOSPITAL_COMMUNITY)
Admission: EM | Admit: 2021-07-16 | Discharge: 2021-07-16 | Disposition: A | Discharge status: Home / Self Care | Payer: Self-pay | Attending: Emergency Medicine | Admitting: Emergency Medicine

## 2021-07-16 ENCOUNTER — Emergency Department (HOSPITAL_COMMUNITY): Payer: Self-pay

## 2021-07-16 ENCOUNTER — Encounter (HOSPITAL_COMMUNITY): Payer: Self-pay

## 2021-07-16 ENCOUNTER — Other Ambulatory Visit: Payer: Self-pay

## 2021-07-16 DIAGNOSIS — H6592 Unspecified nonsuppurative otitis media, left ear: Secondary | ICD-10-CM

## 2021-07-16 DIAGNOSIS — J029 Acute pharyngitis, unspecified: Secondary | ICD-10-CM | POA: Insufficient documentation

## 2021-07-16 DIAGNOSIS — H73892 Other specified disorders of tympanic membrane, left ear: Secondary | ICD-10-CM | POA: Insufficient documentation

## 2021-07-16 LAB — BASIC METABOLIC PANEL
Anion gap: 9 (ref 5–15)
BUN: 15 mg/dL (ref 6–20)
CO2: 23 mmol/L (ref 22–32)
Calcium: 9.4 mg/dL (ref 8.9–10.3)
Chloride: 104 mmol/L (ref 98–111)
Creatinine, Ser: 1.2 mg/dL (ref 0.61–1.24)
GFR, Estimated: >60 mL/min (ref >60)
Glucose, Bld: 107 mg/dL — ABNORMAL HIGH (ref 70–99)
Potassium: 3.9 mmol/L (ref 3.5–5.1)
Sodium: 136 mmol/L (ref 135–145)

## 2021-07-16 LAB — CBC
HCT: 43.8 % (ref 39.0–52.0)
Hemoglobin: 14.2 g/dL (ref 13.0–17.0)
MCH: 24.4 pg — ABNORMAL LOW (ref 26.0–34.0)
MCHC: 32.4 g/dL (ref 30.0–36.0)
MCV: 75.3 fL — ABNORMAL LOW (ref 80.0–100.0)
Platelets: 255 10*3/uL (ref 150–400)
RBC: 5.82 MIL/uL — ABNORMAL HIGH (ref 4.22–5.81)
RDW: 15.8 % — ABNORMAL HIGH (ref 11.5–15.5)
WBC: 6.8 10*3/uL (ref 4.0–10.5)
nRBC: 0 % (ref 0.0–0.2)

## 2021-07-16 LAB — TROPONIN I (HIGH SENSITIVITY): Troponin I (High Sensitivity): 3 ng/L (ref <18)

## 2021-07-16 MED ORDER — LIDOCAINE VISCOUS HCL 2 % MT SOLN
15.0000 mL | Freq: Once | OROMUCOSAL | Status: AC
  Administered 2021-07-16: 15 mL via ORAL
  Filled 2021-07-16: qty 15

## 2021-07-16 MED ORDER — ALUM & MAG HYDROXIDE-SIMETH 200-200-20 MG/5ML PO SUSP
30.0000 mL | Freq: Once | ORAL | Status: AC
  Administered 2021-07-16: 30 mL via ORAL
  Filled 2021-07-16: qty 30

## 2021-07-16 NOTE — Discharge Instructions (Addendum)
You were seen today for multiple symptoms. I recommend resuming your home medications for GERD. You should follow up with a PCP to see if they have different medication recommendations. Your left ear has an effusion. I recommend continuing to try to gently "pop" your ear. You may also use a steroid intranasal spray such as flonase. Your vision was 20/20. Recommend follow up with an eye care professional.  ?

## 2021-07-16 NOTE — ED Notes (Signed)
Bilateral visual acuity 20/20.  ?

## 2021-07-16 NOTE — ED Provider Notes (Signed)
?[facility]-EMERGENCY DEPT ?Provider Note ? ? ?CSN: [id number] ?Arrival date & time: 07/16/21  1317 ? ?  ? ?History ? ?Chief Complaint  ?Patient presents with  ? Otalgia  ? Sore Throat  ? Blurred Vision  ? ? ?Kyle Crane is a 36 y.o. male.  Patient complains of a 2 day history of burning pain after eating. The patient states that it feels like it is down "deep in his throat".  He complains of occasionally feeling that he needs to vomit in order to get rid of the food to feel better. The patient also complains of left ear fullness. He tried ear wax removal drops at home. The patient was hypertensive upon arrival and states when asked that he thinks his vision has been worse than usual over the past week. PMH significant for hx of pyelonephritis, renal disorder, GERD. The patient was previously prescribed famotidine, protonix, and carafate but discontinued them in 2021. He currently takes tums and prilosec.  ? ?HPI ? ?  ? ?Home Medications ?Prior to Admission medications   ?Medication Sig Start Date End Date Taking? Authorizing Provider  ?calcium carbonate (TUMS - DOSED IN MG ELEMENTAL CALCIUM) 500 MG chewable tablet Chew 1-2 tablets by mouth as needed for indigestion or heartburn.    [provider]  ?famotidine (PEPCID) 20 MG tablet Take 1 tablet (20 mg total) by mouth 2 (two) times daily as needed for heartburn or indigestion. ?Patient not taking: Reported on 10/08/2019 06/24/17   Muthersbaugh, Dahlia Client, PA-C  ?omeprazole (PRILOSEC OTC) 20 MG tablet Take 20 mg by mouth daily.    [provider]  ?pantoprazole (PROTONIX) 40 MG tablet Take 1 tablet (40 mg total) by mouth daily. ?Patient not taking: Reported on 10/08/2019 06/24/17   Muthersbaugh, Dahlia Client, PA-C  ?sucralfate (CARAFATE) 1 g tablet Take 1 tablet (1 g total) by mouth 4 (four) times daily -  with meals and at bedtime. ?Patient not taking: Reported on 10/08/2019 06/24/17   Muthersbaugh, Dahlia Client, PA-C  ?   ? ?Allergies     ?Tylenol [acetaminophen]   ? ?Review of Systems   ?Review of Systems  ?Constitutional:  Negative for fever.  ?HENT:  Positive for ear pain (Fullness on left side) and sore throat. Negative for ear discharge and hearing loss.   ?Eyes:  Positive for visual disturbance (Possible visual acuity change).  ?Respiratory:  Negative for shortness of breath.   ?Cardiovascular:  Negative for chest pain and palpitations.  ?Gastrointestinal:  Negative for abdominal pain, nausea and vomiting.  ?Genitourinary:  Negative for dysuria.  ?Neurological:  Negative for headaches.  ? ?Physical Exam ?Updated Vital Signs ?BP (!) 132/92   Pulse 97   Temp 98.3 ?F (36.8 ?C) (Oral)   Resp 18   Ht 5\' 10"  (1.778 m)   Wt 127 kg   SpO2 96%   BMI 40.18 kg/m?  ?Physical Exam ?Vitals and nursing note reviewed.  ?Constitutional:   ?   General: He is not in acute distress. ?   Appearance: He is obese.  ?HENT:  ?   Head: Normocephalic.  ?   Left Ear: A middle ear effusion is present. Tympanic membrane is not erythematous.  ?   Mouth/Throat:  ?   Mouth: Mucous membranes are moist.  ?   Pharynx: No posterior oropharyngeal erythema.  ?Eyes:  ?   Conjunctiva/sclera: Conjunctivae normal.  ?Cardiovascular:  ?   Rate and Rhythm: Normal rate and regular rhythm.  ?   Heart sounds: Normal  heart sounds.  ?Pulmonary:  ?   Effort: Pulmonary effort is normal. No respiratory distress.  ?   Breath sounds: Normal breath sounds.  ?Abdominal:  ?   General: There is no distension.  ?   Palpations: Abdomen is soft.  ?Musculoskeletal:  ?   Cervical back: Normal range of motion.  ?Skin: ?   General: Skin is warm and dry.  ?Neurological:  ?   Mental Status: He is alert.  ? ? ?ED Results / Procedures / Treatments   ?Labs ?(all labs ordered are listed, but only abnormal results are displayed) ?Labs Reviewed  ?BASIC METABOLIC PANEL - Abnormal; Notable for the following components:  ?    Result Value  ? Glucose, Bld 107 (*)   ? All other components within normal limits  ?CBC  - Abnormal; Notable for the following components:  ? RBC 5.82 (*)   ? MCV 75.3 (*)   ? MCH 24.4 (*)   ? RDW 15.8 (*)   ? All other components within normal limits  ?TROPONIN I (HIGH SENSITIVITY)  ?TROPONIN I (HIGH SENSITIVITY)  ? ? ?EKG ?None ? ?Radiology ?DG Chest Port 1 View ? ?Result Date: 07/16/2021 ?CLINICAL DATA:  Chest pain EXAM: PORTABLE CHEST 1 VIEW COMPARISON:  Chest x-ray dated November 07, 2019 FINDINGS: The heart size and mediastinal contours are within normal limits. Both lungs are clear. The visualized skeletal structures are unremarkable. IMPRESSION: No active disease. Electronically Signed   By: Allegra Lai M.D.   On: 07/16/2021 14:18   ? ?Procedures ?Procedures  ? ? ?Medications Ordered in ED ?Medications  ?alum & mag hydroxide-simeth (MAALOX/MYLANTA) 200-200-20 MG/5ML suspension 30 mL (30 mLs Oral Given 07/16/21 1631)  ?  And  ?lidocaine (XYLOCAINE) 2 % viscous mouth solution 15 mL (15 mLs Oral Given 07/16/21 1632)  ? ? ?ED Course/ Medical Decision Making/ A&P ?  ?                        ?Medical Decision Making ?Risk ?OTC drugs. ?Prescription drug management. ? ? ?This patient presents to the ED for concern of chest burning, this involves an extensive number of treatment options, and is a complaint that carries with it a high risk of complications and morbidity.  The differential diagnosis includes GERD, ACS, and others ? ? ?Co morbidities that complicate the patient evaluation ? ?GERD history ? ? ?Additional history obtained: ? ? ?External records from outside source obtained and reviewed including ED notes from 09/2019 ? ? ?Lab Tests: ? ?I Ordered, and personally interpreted labs.  The pertinent results include:  Initial troponin 3 ? ? ?Imaging Studies ordered: ? ?I ordered imaging studies including chest x-ray  ?I independently visualized and interpreted imaging which showed no active disease ?I agree with the radiologist interpretation ? ? ?Cardiac Monitoring: / EKG: ? ?The patient was  maintained on a cardiac monitor.  I personally viewed and interpreted the cardiac monitored which showed an underlying rhythm of: sinus ryhthm ? ? ?Problem List / ED Course / Critical interventions / Medication management ? ?I ordered medication including a GI cocktail for GERD symptoms  ?Reevaluation of the patient after these medicines showed that the patient improved ?I have reviewed the patients home medicines and have made adjustments as needed ? ? ? ?Test / Admission - Considered: ? ?The patient's troponin was 3 initially. Heart pathway risk score 3, "consider discharge with follow up and no additional troponin testing".  EKG and troponin  are reassuring for no ACS. The patient's symptoms improved with GI cocktail. I see no reason for further testing or imaging at this time. The patient likely has a repeat of his previous GERD symptoms. As to the patient's left ear, the TM appears to have an effusion. I will provide home care instructions. The patient's vision was 20/20. BP improved greatly without intervention. I recommend discharge home. There is no indication for hospitalization.  ? ?Final Clinical Impression(s) / ED Diagnoses ?Final diagnoses:  ?Fluid level behind tympanic membrane of left ear  ?Sore throat  ? ? ?Rx / DC Orders ?ED Discharge Orders   ? ? None  ? ?  ? ? ?  ?Darrick GrinderMcCauley, Dayquan Buys B, PA-C ?07/16/21 1732 ? ?  ?Jacalyn LefevreHaviland, Julie, MD ?07/16/21 2304 ? ?

## 2021-07-16 NOTE — ED Triage Notes (Addendum)
Patient c/o sore throat and left ear pain x 5 days. ? ?BP in triage 190/120. Patient does not take BP meds. Patient states he has had blurred vision x 1 week. ?

## 2021-07-16 NOTE — ED Provider Triage Note (Signed)
Emergency Medicine Provider Triage Evaluation Note ? ?Kyle Crane , a 36 y.o. male  was evaluated in triage.  Pt complains of chest pain. ? ?Review of Systems  ?Positive: Midsternal chest pain, indigestion, L ear pain ?Negative: Fever, cough, sob ? ?Physical Exam  ?BP (!) 191/120 (BP Location: Right Arm)   Pulse (!) 109   Temp 98.3 ?F (36.8 ?C) (Oral)   Resp 18   Ht 5\' 10"  (1.778 m)   Wt 127 kg   SpO2 96%   BMI 40.18 kg/m?  ?Gen:   Awake, no distress   ?Resp:  Normal effort  ?MSK:   Moves extremities without difficulty  ?Other:   ? ?Medical Decision Making  ?Medically screening exam initiated at 1:50 PM.  Appropriate orders placed.  was informed that the remainder of the evaluation will be completed by another provider, this initial triage assessment does not replace that evaluation, and the importance of remaining in the ED until their evaluation is complete. ? ?Report indigestion and midsternal cp for nearly a week.  Felt like heart burn.  Also report L ear discomfort x 5 days.  BP is elevated, no known hx of HTN ?  ?Kyle Maria, PA-C ?07/16/21 1355 ? ?
